# Patient Record
Sex: Female | Born: 1950 | Race: White | Hispanic: No | Marital: Single | Smoking: Current every day smoker
Health system: Southern US, Community
[De-identification: ages and names within clinical notes are randomized; demographics above are authoritative.]

## PROBLEM LIST (undated history)

## (undated) DIAGNOSIS — I700 Atherosclerosis of aorta: Secondary | ICD-10-CM

## (undated) DIAGNOSIS — M810 Age-related osteoporosis without current pathological fracture: Secondary | ICD-10-CM

## (undated) DIAGNOSIS — F17210 Nicotine dependence, cigarettes, uncomplicated: Secondary | ICD-10-CM

## (undated) DIAGNOSIS — B351 Tinea unguium: Secondary | ICD-10-CM

## (undated) DIAGNOSIS — Z8249 Family history of ischemic heart disease and other diseases of the circulatory system: Secondary | ICD-10-CM

## (undated) DIAGNOSIS — D229 Melanocytic nevi, unspecified: Secondary | ICD-10-CM

## (undated) DIAGNOSIS — Z9889 Other specified postprocedural states: Secondary | ICD-10-CM

## (undated) DIAGNOSIS — H269 Unspecified cataract: Secondary | ICD-10-CM

## (undated) DIAGNOSIS — R030 Elevated blood-pressure reading, without diagnosis of hypertension: Secondary | ICD-10-CM

## (undated) DIAGNOSIS — C50919 Malignant neoplasm of unspecified site of unspecified female breast: Secondary | ICD-10-CM

## (undated) DIAGNOSIS — E785 Hyperlipidemia, unspecified: Secondary | ICD-10-CM

## (undated) DIAGNOSIS — M8580 Other specified disorders of bone density and structure, unspecified site: Secondary | ICD-10-CM

## (undated) DIAGNOSIS — E559 Vitamin D deficiency, unspecified: Secondary | ICD-10-CM

## (undated) DIAGNOSIS — F32A Depression, unspecified: Secondary | ICD-10-CM

## (undated) DIAGNOSIS — J439 Emphysema, unspecified: Secondary | ICD-10-CM

## (undated) DIAGNOSIS — I2510 Atherosclerotic heart disease of native coronary artery without angina pectoris: Secondary | ICD-10-CM

## (undated) DIAGNOSIS — Z98811 Dental restoration status: Secondary | ICD-10-CM

## (undated) DIAGNOSIS — R112 Nausea with vomiting, unspecified: Secondary | ICD-10-CM

## (undated) HISTORY — DX: Atherosclerosis of aorta: I70.0

## (undated) HISTORY — DX: Other specified disorders of bone density and structure, unspecified site: M85.80

## (undated) HISTORY — DX: Hyperlipidemia, unspecified: E78.5

## (undated) HISTORY — DX: Depression, unspecified: F32.A

## (undated) HISTORY — DX: Emphysema, unspecified: J43.9

## (undated) HISTORY — DX: Tinea unguium: B35.1

## (undated) HISTORY — PX: AUGMENTATION MAMMAPLASTY: SUR837

## (undated) HISTORY — DX: Vitamin D deficiency, unspecified: E55.9

## (undated) HISTORY — DX: Family history of ischemic heart disease and other diseases of the circulatory system: Z82.49

## (undated) HISTORY — DX: Age-related osteoporosis without current pathological fracture: M81.0

## (undated) HISTORY — DX: Melanocytic nevi, unspecified: D22.9

## (undated) HISTORY — DX: Nicotine dependence, cigarettes, uncomplicated: F17.210

## (undated) HISTORY — PX: BREAST ENHANCEMENT SURGERY: SHX7

## (undated) HISTORY — DX: Atherosclerotic heart disease of native coronary artery without angina pectoris: I25.10

## (undated) HISTORY — DX: Elevated blood-pressure reading, without diagnosis of hypertension: R03.0

---

## 1998-08-07 ENCOUNTER — Other Ambulatory Visit: Admission: RE | Admit: 1998-08-07 | Discharge: 1998-08-07 | Payer: Self-pay | Admitting: Obstetrics and Gynecology

## 1998-08-20 ENCOUNTER — Ambulatory Visit (HOSPITAL_COMMUNITY): Admission: RE | Admit: 1998-08-20 | Discharge: 1998-08-20 | Payer: Self-pay | Admitting: Obstetrics and Gynecology

## 1998-08-20 ENCOUNTER — Encounter: Payer: Self-pay | Admitting: Obstetrics and Gynecology

## 2001-04-13 ENCOUNTER — Other Ambulatory Visit: Admission: RE | Admit: 2001-04-13 | Discharge: 2001-04-13 | Payer: Self-pay | Admitting: Obstetrics and Gynecology

## 2012-02-02 ENCOUNTER — Encounter: Payer: Self-pay | Admitting: Internal Medicine

## 2012-06-07 ENCOUNTER — Encounter: Payer: Self-pay | Admitting: Internal Medicine

## 2012-07-15 ENCOUNTER — Encounter: Payer: Self-pay | Admitting: Internal Medicine

## 2012-07-15 ENCOUNTER — Ambulatory Visit (AMBULATORY_SURGERY_CENTER): Payer: BC Managed Care – PPO | Admitting: *Deleted

## 2012-07-15 VITALS — Ht 60.0 in | Wt 114.0 lb

## 2012-07-15 DIAGNOSIS — Z1211 Encounter for screening for malignant neoplasm of colon: Secondary | ICD-10-CM

## 2012-07-15 MED ORDER — MOVIPREP 100 G PO SOLR: ORAL | Status: DC

## 2012-08-05 ENCOUNTER — Ambulatory Visit (AMBULATORY_SURGERY_CENTER): Payer: BC Managed Care – PPO | Admitting: Internal Medicine

## 2012-08-05 ENCOUNTER — Encounter: Payer: Self-pay | Admitting: Internal Medicine

## 2012-08-05 VITALS — BP 138/80 | HR 77 | Temp 98.3°F | Resp 18 | Ht 60.0 in | Wt 114.0 lb

## 2012-08-05 DIAGNOSIS — D126 Benign neoplasm of colon, unspecified: Secondary | ICD-10-CM

## 2012-08-05 DIAGNOSIS — Z1211 Encounter for screening for malignant neoplasm of colon: Secondary | ICD-10-CM

## 2012-08-05 HISTORY — PX: COLONOSCOPY WITH PROPOFOL: SHX5780

## 2012-08-05 MED ORDER — SODIUM CHLORIDE 0.9 % IV SOLN: 500.0000 mL | INTRAVENOUS | Status: DC

## 2012-08-05 NOTE — Patient Instructions (Addendum)
Discharge instructions given with verbal understanding. Handouts on polyps and diverticulosis. Resume previous medications. Hold aspirin and aspirin products for two weeks. YOU HAD AN ENDOSCOPIC PROCEDURE TODAY AT THE [facility]: Refer to the procedure report that was given to you for any specific questions about what was found during the examination.  If the procedure report does not answer your questions, please call your gastroenterologist to clarify.  If you requested that your care partner not be given the details of your procedure findings, then the procedure report has been included in a sealed envelope for you to review at your convenience later.  YOU SHOULD EXPECT: Some feelings of bloating in the abdomen. Passage of more gas than usual.  Walking can help get rid of the air that was put into your GI tract during the procedure and reduce the bloating. If you had a lower endoscopy (such as a colonoscopy or flexible sigmoidoscopy) you may notice spotting of blood in your stool or on the toilet paper. If you underwent a bowel prep for your procedure, then you may not have a normal bowel movement for a few days.  DIET: Your first meal following the procedure should be a light meal and then it is ok to progress to your normal diet.  A half-sandwich or bowl of soup is an example of a good first meal.  Heavy or fried foods are harder to digest and may make you feel nauseous or bloated.  Likewise meals heavy in dairy and vegetables can cause extra gas to form and this can also increase the bloating.  Drink plenty of fluids but you should avoid alcoholic beverages for 24 hours.  ACTIVITY: Your care partner should take you home directly after the procedure.  You should plan to take it easy, moving slowly for the rest of the day.  You can resume normal activity the day after the procedure however you should NOT DRIVE or use heavy machinery for 24 hours (because of the sedation medicines used  during the test).    SYMPTOMS TO REPORT IMMEDIATELY: A gastroenterologist can be reached at any hour.  During normal business hours, 8:30 AM to 5:00 PM Monday through Friday, call [phone].  After hours and on weekends, please call the GI answering service at [phone] who will take a message and have the physician on call contact you.   Following lower endoscopy (colonoscopy or flexible sigmoidoscopy):  Excessive amounts of blood in the stool  Significant tenderness or worsening of abdominal pains  Swelling of the abdomen that is new, acute  Fever of 100F or higher  FOLLOW UP: If any biopsies were taken you will be contacted by phone or by letter within the next 1-3 weeks.  Call your gastroenterologist if you have not heard about the biopsies in 3 weeks.  Our staff will call the home number listed on your records the next business day following your procedure to check on you and address any questions or concerns that you may have at that time regarding the information given to you following your procedure. This is a courtesy call and so if there is no answer at the home number and we have not heard from you through the emergency physician on call, we will assume that you have returned to your regular daily activities without incident.  SIGNATURES/CONFIDENTIALITY: You and/or your care partner have signed paperwork which will be entered into your electronic medical record.  These signatures attest to the fact that that  the information above on your After Visit Summary has been reviewed and is understood.  Full responsibility of the confidentiality of this discharge information lies with you and/or your care-partner.

## 2012-08-05 NOTE — Op Note (Signed)
[facility] [address] [place], [address]   COLONOSCOPY PROCEDURE REPORT  PATIENT: Jasmine, Blanchard  MR#: [id number] BIRTHDATE: Sep 01, 1950 , 62  yrs. old GENDER: Female ENDOSCOPIST: Hart Carwin, MD REFERRED BY:  Creola Corn, M.D. PROCEDURE DATE:  08/05/2012 PROCEDURE:   Colonoscopy with cold biopsy polypectomy and Colonoscopy with snare polypectomy ASA CLASS:   Class I INDICATIONS:Average risk patient for colon cancer and normal colonoscopy in1/2004. MEDICATIONS: MAC sedation, administered by CRNA, Propofol (Diprivan), and propofol (Diprivan) 200mg  IV  DESCRIPTION OF PROCEDURE:   After the risks and benefits and of the procedure were explained, informed consent was obtained.  A digital rectal exam revealed no abnormalities of the rectum.    The LB [id number]-[id number]  endoscope was introduced through the anus and advanced to the cecum, which was identified by both the appendix and ileocecal valve .  The quality of the prep was excellent, using MoviPrep .  The instrument was then slowly withdrawn as the colon was fully examined.     COLON FINDINGS: Three polypoid shaped sessile polyps ranging between 3-62mm in size were found at the cecum.x2 and 10 cm x1  A polypectomy was performed with cold forceps ( at 10 cm)  and with a cold snare ( cecum).  The resection was complete and the polyp tissue was completely retrieved. Mild diverticulosis of the sigmoid colon.    Retroflexed views revealed no abnormalities.     The scope was then withdrawn from the patient and the procedure completed.  COMPLICATIONS: There were no complications. ENDOSCOPIC IMPRESSION: Three sessile polyps ranging between 3-38mm in size were found at the cecum; polypectomy was performed with cold forceps and with a cold snare Mild diverticulosis of sigmoid colon  RECOMMENDATIONS: 1.  Await pathology results 2.  High fiber diet 3. no ASA x 2 weeks  REPEAT EXAM: In 5 year(s)  for  Colonoscopy.  cc:  _______________________________ eSignedHart Carwin, MD 08/05/2012 9:43 AM     PATIENT NAME:  Jasmine, Blanchard MR#: [id number]

## 2012-08-05 NOTE — Progress Notes (Signed)
Called to room to assist during endoscopic procedure.  Patient ID and intended procedure confirmed with present staff. Received instructions for my participation in the procedure from the performing physician.  

## 2012-08-05 NOTE — Progress Notes (Signed)
Patient did not experience any of the following events: a burn prior to discharge; a fall within the facility; wrong site/side/patient/procedure/implant event; or a hospital transfer or hospital admission upon discharge from the facility. (G8907) Patient did not have preoperative order for IV antibiotic SSI prophylaxis. (G8918)  

## 2012-08-08 ENCOUNTER — Telehealth: Payer: Self-pay

## 2012-08-08 NOTE — Telephone Encounter (Signed)
  Follow up Call-  Call back number 08/05/2012  Post procedure Call Back phone  # [phone]  Permission to leave phone message Yes     Patient questions:  Do you have a fever, pain , or abdominal swelling? no Pain Score  0 *  Have you tolerated food without any problems? yes  Have you been able to return to your normal activities? yes  Do you have any questions about your discharge instructions: Diet   no Medications  no Follow up visit  no  Do you have questions or concerns about your Care? no  Actions: * If pain score is 4 or above: No action needed, pain <4.

## 2012-08-09 ENCOUNTER — Encounter: Payer: Self-pay | Admitting: Internal Medicine

## 2012-10-23 ENCOUNTER — Emergency Department (HOSPITAL_COMMUNITY)
Admission: EM | Admit: 2012-10-23 | Discharge: 2012-10-23 | Disposition: A | Discharge status: Home / Self Care | Payer: BC Managed Care – PPO | Attending: Emergency Medicine | Admitting: Emergency Medicine

## 2012-10-23 ENCOUNTER — Encounter (HOSPITAL_COMMUNITY): Payer: Self-pay | Admitting: Emergency Medicine

## 2012-10-23 DIAGNOSIS — Z7982 Long term (current) use of aspirin: Secondary | ICD-10-CM | POA: Insufficient documentation

## 2012-10-23 DIAGNOSIS — F17200 Nicotine dependence, unspecified, uncomplicated: Secondary | ICD-10-CM | POA: Insufficient documentation

## 2012-10-23 DIAGNOSIS — Z79899 Other long term (current) drug therapy: Secondary | ICD-10-CM | POA: Insufficient documentation

## 2012-10-23 DIAGNOSIS — Z862 Personal history of diseases of the blood and blood-forming organs and certain disorders involving the immune mechanism: Secondary | ICD-10-CM | POA: Insufficient documentation

## 2012-10-23 DIAGNOSIS — Z8639 Personal history of other endocrine, nutritional and metabolic disease: Secondary | ICD-10-CM | POA: Insufficient documentation

## 2012-10-23 DIAGNOSIS — B029 Zoster without complications: Secondary | ICD-10-CM | POA: Insufficient documentation

## 2012-10-23 MED ORDER — VALACYCLOVIR HCL 1 G PO TABS: 1000.0000 mg | ORAL_TABLET | Freq: Three times a day (TID) | ORAL | Status: AC

## 2012-10-23 NOTE — ED Provider Notes (Signed)
CSN: [id number]     Arrival date & time 10/23/12  4098 History   First MD Initiated Contact with Patient 10/23/12 0759     Chief Complaint  Patient presents with  . Rash   (Consider location/radiation/quality/duration/timing/severity/associated sxs/prior Treatment) HPI Comments: Jasmine Blanchard is a 62 y.o. female who states that she developed a rash on her right upper thigh 3 days ago. That has gradually worsened and spread to the right buttocks and right posterior knee. The rash is mildly uncomfortable and itchy. No fever, chills, nausea, vomiting, weakness, or dizziness. She had a mole removed from the right lower flank 3 weeks ago. This area is just above the location of the rash on her right buttock. There no known modifying factors. There been no new medications taken.   Patient is a 62 y.o. female presenting with rash. The history is provided by the patient.  Rash   Past Medical History  Diagnosis Date  . Hyperlipidemia   . Elevated triglycerides with high cholesterol    Past Surgical History  Procedure Laterality Date  . Cosmetic surgery      x2   Family History  Problem Relation Age of Onset  . Colon cancer Neg Hx    History  Substance Use Topics  . Smoking status: Current Every Day Smoker -- 1.00 packs/day for 40 years    Types: Cigarettes  . Smokeless tobacco: Never Used  . Alcohol Use: 8.4 oz/week    14 Glasses of wine per week     Comment: about 1-2 glasses wine daily per pt.   OB History   Grav Para Term Preterm Abortions TAB SAB Ect Mult Living                 Review of Systems  Skin: Positive for rash.  All other systems reviewed and are negative.    Allergies  Review of patient's allergies indicates no known allergies.  Home Medications   Current Outpatient Rx  Name  Route  Sig  Dispense  Refill  . aspirin 81 MG tablet   Oral   Take 81 mg by mouth daily.         Marland Kitchen BIOTIN PO   Oral   Take 1 tablet by mouth daily.         .  Cholecalciferol (VITAMIN D PO)   Oral   Take 1 tablet by mouth daily.         . Coenzyme Q10 (CO Q-10 PO)   Oral   Take 1 tablet by mouth daily.         . fenofibrate 54 MG tablet   Oral   Take 54 mg by mouth daily.         . Omega-3 Fatty Acids (FISH OIL PO)   Oral   Take 1 tablet by mouth daily.         . valACYclovir (VALTREX) 1000 MG tablet   Oral   Take 1 tablet (1,000 mg total) by mouth 3 (three) times daily.   31 tablet   0    BP 156/83  Pulse 109  Temp(Src) 98.4 F (36.9 C) (Oral)  Resp 15  Wt 109 lb (49.442 kg)  BMI 21.29 kg/m2  SpO2 98% Physical Exam  Nursing note and vitals reviewed. Constitutional: She is oriented to person, place, and time. She appears well-developed and well-nourished.  HENT:  Head: Normocephalic and atraumatic.  Eyes: Conjunctivae and EOM are normal. Pupils are equal, round, and reactive to  light.  Neck: Normal range of motion and phonation normal. Neck supple.  Cardiovascular: Normal rate and intact distal pulses.   Pulmonary/Chest: Effort normal. She exhibits no tenderness.  Musculoskeletal: Normal range of motion.  Neurological: She is alert and oriented to person, place, and time. She exhibits normal muscle tone.  Skin: Skin is warm and dry.  Red blotchy rash, flat, right upper medial thigh, right, buttock, and small patch on right posterior knee. The area on the right medial thigh has numerous vesicles. There is no associated induration, drainage, or associated lymphadenopathy.  Psychiatric: She has a normal mood and affect. Her behavior is normal. Judgment and thought content normal.    ED Course  Procedures (including critical care time)  Findings discussed with patient. She agrees with the treatment plan.  MDM   1. Shingles    Nonspecific rash with differential including allergic dermatitis, drug reaction, shingles, nonspecific viral process. Her symptoms are mild. She is stable for discharge.  Nursing Notes  Reviewed/ Care Coordinated, and agree without changes. Applicable Imaging Reviewed.  Interpretation of Laboratory Data incorporated into ED treatment   Plan: Home Medications- Valtrex; Home Treatments and Observation- rash care by daily cleansing ; return here if the recommended treatment, does not improve the symptoms; Recommended follow up- PCP, when necessary      Flint Melter, MD 10/23/12 [phone]

## 2012-10-23 NOTE — ED Notes (Signed)
Pt. Stated, i started having a rash 2 days ago.  Rash is mostly on my bottom and it started between my legs.

## 2013-10-27 ENCOUNTER — Ambulatory Visit (INDEPENDENT_AMBULATORY_CARE_PROVIDER_SITE_OTHER): Payer: BC Managed Care – PPO

## 2013-10-27 ENCOUNTER — Encounter: Payer: Self-pay | Admitting: Podiatrist

## 2013-10-27 ENCOUNTER — Ambulatory Visit (INDEPENDENT_AMBULATORY_CARE_PROVIDER_SITE_OTHER): Payer: BC Managed Care – PPO | Admitting: Podiatrist

## 2013-10-27 VITALS — BP 140/88 | HR 91 | Resp 16 | Ht 61.0 in | Wt 107.0 lb

## 2013-10-27 DIAGNOSIS — M2010 Hallux valgus (acquired), unspecified foot: Secondary | ICD-10-CM

## 2013-10-27 NOTE — Patient Instructions (Signed)

## 2013-10-27 NOTE — Progress Notes (Signed)
   Subjective:    Patient ID: Jasmine Blanchard, female    DOB: July 06, 1950, 63 y.o.   MRN: [id number]  HPI Comments: "I have these bunions"  Patient c/o discomfort 1st MPJ bilateral for several years. She only has pain when wearing dress shoes. The areas get red and swollen occasionally. Dr. Virgina Jock referred to have checked and any preventative or accommodative measures.  Foot Pain      Review of Systems  All other systems reviewed and are negative.      Objective:   Physical Exam  Patient is awake, alert, and oriented x 3.  In no acute distress.  Vascular status is intact with palpable pedal pulses at 2/4 DP and PT bilateral and capillary refill time within normal limits. Neurological sensation is also intact bilaterally via Semmes Weinstein monofilament at 5/5 sites. Light touch, vibratory sensation, Achilles tendon reflex is intact. Dermatological exam reveals skin color, turger and texture as normal. No open lesions present.  Musculature intact with dorsiflexion, plantarflexion, inversion, eversion. Bunion deformity is present bilateral.  No pain with range of motion is present.  No pain with medial to lateral compression of the foot is noted.  Mild lateral deviation of the hallux is noted but not causing lateral drifting of the lesser toes.  Deformity present not causing pain  xrays taken and reveal bunion deformities bilateral.  Lateral deviation of the hallux also noted bilateral.   Assessment & Plan:  Bunion deformity bilateral  Plan:  Discussed conservative versus surgical options.  Discussed an orthotic to be worn in a lace up shoe to help prevent the further drifting of the first metatarsal and to prevent a worsening bunion and the patient is not interested due to not wearing this type of shoes on a normal basis.  Discussed very briefly surgical correction however she is not interested in this option at this time.  She will be seen back as needed for follow up.  Dispensed written  materals regarding bunions.

## 2014-12-17 ENCOUNTER — Encounter: Payer: Self-pay | Admitting: Internal Medicine

## 2016-07-09 ENCOUNTER — Telehealth: Payer: Self-pay | Admitting: Nurse Practitioner

## 2016-07-09 NOTE — Telephone Encounter (Signed)
Called and left a message for patient to call back to schedule a new patient doctor referral with Kem Boroughs, FNP for an AEX.

## 2016-07-15 ENCOUNTER — Other Ambulatory Visit: Payer: Self-pay | Admitting: Radiology

## 2016-07-16 DIAGNOSIS — C50919 Malignant neoplasm of unspecified site of unspecified female breast: Secondary | ICD-10-CM

## 2016-07-16 HISTORY — DX: Malignant neoplasm of unspecified site of unspecified female breast: C50.919

## 2016-07-17 ENCOUNTER — Telehealth: Payer: Self-pay | Admitting: *Deleted

## 2016-07-17 NOTE — Telephone Encounter (Signed)
Confirmed [facility] for 07/29/16 at 0815.  Instructions and contact information given.

## 2016-07-23 ENCOUNTER — Other Ambulatory Visit: Payer: Self-pay | Admitting: *Deleted

## 2016-07-23 DIAGNOSIS — Z170 Estrogen receptor positive status [ER+]: Principal | ICD-10-CM

## 2016-07-23 DIAGNOSIS — C50411 Malignant neoplasm of upper-outer quadrant of right female breast: Secondary | ICD-10-CM

## 2016-07-24 ENCOUNTER — Encounter: Payer: Self-pay | Admitting: Genetics

## 2016-07-28 ENCOUNTER — Encounter: Payer: Self-pay | Admitting: Radiation Oncology

## 2016-07-28 NOTE — Progress Notes (Signed)
Radiation Oncology         [phone] ________________________________  Name: Jasmine Blanchard MRN: [id number]  Date: 07/29/2016  DOB: 15-Dec-1950  HA:LPFXT, [name], MD  Alphonsa Overall, MD     REFERRING PHYSICIAN: Alphonsa Overall, MD   DIAGNOSIS: The encounter diagnosis was Malignant neoplasm of upper-outer quadrant of right breast in female, estrogen receptor positive ([place]).   HISTORY OF PRESENT ILLNESS: Jasmine Blanchard is a 66 y.o. female seen in the multidisciplinary breast clinic for a new diagnosis of right breast cancer. She was found on screening mammogram to have a right breast assymmetry. She underwent diagnostic imaging to follow this and this revealed an 8 mm mass at the 10 o'clock position in the upper outer quadrant of the right breast. No adenopathy was noted of the axilla by ultrasound. A biopsy on 07/15/16 of the right breast mass revealed a grade 1, ER/PR positive invasive ductal carcinoma. She comes today to discuss options for treatment of her cancer.    PREVIOUS RADIATION THERAPY: No   PAST MEDICAL HISTORY:  Past Medical History:  Diagnosis Date  . Breast cancer ([place]) 07/16/2016   right ER/PR positive, grade 1 invasive ductal carcinoma  . Elevated triglycerides with high cholesterol   . Hyperlipidemia        PAST SURGICAL HISTORY: Past Surgical History:  Procedure Laterality Date  . COSMETIC SURGERY     x2     FAMILY HISTORY:  Family History  Problem Relation Age of Onset  . Breast cancer Mother   . Colon cancer Neg Hx      SOCIAL HISTORY:  reports that she has been smoking Cigarettes.  She has a 40.00 pack-year smoking history. She has never used smokeless tobacco. She reports that she drinks about 8.4 oz of alcohol per week . She reports that she does not use drugs. The patient is single and resides in [place]. She is an Forensic psychologist.    ALLERGIES: Patient has no known allergies.   MEDICATIONS:  Current Outpatient Prescriptions  Medication Sig  Dispense Refill  . aspirin 81 MG tablet Take 81 mg by mouth daily.    Marland Kitchen BIOTIN PO Take 1 tablet by mouth daily.    . Cholecalciferol (VITAMIN D PO) Take 1 tablet by mouth daily.    . Coenzyme Q10 (CO Q-10 PO) Take 1 tablet by mouth daily.    . fenofibrate 54 MG tablet Take 54 mg by mouth daily.    . Omega-3 Fatty Acids (FISH OIL PO) Take 1 tablet by mouth daily.     No current facility-administered medications for this encounter.      REVIEW OF SYSTEMS: On review of systems, the patient reports that she is doing well overall. She denies any chest pain, shortness of breath, cough, fevers, chills, night sweats, unintended weight changes. She denies any bowel or bladder disturbances, and denies abdominal pain, nausea or vomiting. She denies any new musculoskeletal or joint aches or pains. A complete review of systems is obtained and is otherwise negative.     PHYSICAL EXAM:  Wt Readings from Last 3 Encounters:  07/29/16 113 lb 6.4 oz (51.4 kg)  10/27/13 107 lb (48.5 kg)  10/23/12 109 lb (49.4 kg)   Temp Readings from Last 3 Encounters:  07/29/16 98.3 F (36.8 C) (Oral)  10/23/12 98.4 F (36.9 C)  08/05/12 98.3 F (36.8 C) (Tympanic)   BP Readings from Last 3 Encounters:  07/29/16 (!) 165/87  10/27/13 140/88  10/23/12 156/83  Pulse Readings from Last 3 Encounters:  07/29/16 94  10/27/13 91  10/23/12 109     In general this is a well appearing caucasian female in no acute distress. She is alert and oriented x4 and appropriate throughout the examination. HEENT reveals that the patient is normocephalic, atraumatic. EOMs are intact. PERRLA. Skin is intact without any evidence of gross lesions. Cardiovascular exam reveals a regular rate and rhythm, no clicks rubs or murmurs are auscultated. Chest is clear to auscultation bilaterally. Lymphatic assessment is performed and does not reveal any adenopathy in the cervical, supraclavicular, axillary, or inguinal chains. Abdomen has active  bowel sounds in all quadrants and is intact. Bilateral breast exam is performed and reveals bilateral breast implants that are symmetric. There is a small ecchymosis of the right breast in the lower outer quadrant consistent with her biopsy site. No palpable mass is noted. No mass or abnormalities are palpable of the left breast, and no nipple bleeding or discharge is noted of either breast. The abdomen is soft, non tender, non distended. Lower extremities are negative for pretibial pitting edema, deep calf tenderness, cyanosis or clubbing.   ECOG = 0  0 - Asymptomatic (Fully active, able to carry on all predisease activities without restriction)  1 - Symptomatic but completely ambulatory (Restricted in physically strenuous activity but ambulatory and able to carry out work of a light or sedentary nature. For example, light housework, office work)  2 - Symptomatic, <50% in bed during the day (Ambulatory and capable of all self care but unable to carry out any work activities. Up and about more than 50% of waking hours)  3 - Symptomatic, >50% in bed, but not bedbound (Capable of only limited self-care, confined to bed or chair 50% or more of waking hours)  4 - Bedbound (Completely disabled. Cannot carry on any self-care. Totally confined to bed or chair)  5 - Death   Eustace Pen MM, Creech RH, Tormey DC, et al. [phone]). "Toxicity and response criteria of the [facility]". [place] Oncol. 5 (6): 649-55    LABORATORY DATA:  Lab Results  Component Value Date   WBC 7.0 07/29/2016   HGB 14.5 07/29/2016   HCT 41.4 07/29/2016   MCV 94.0 07/29/2016   PLT 352 07/29/2016   Lab Results  Component Value Date   NA 140 07/29/2016   K 4.2 07/29/2016   CO2 25 07/29/2016   Lab Results  Component Value Date   ALT 24 07/29/2016   AST 29 07/29/2016   ALKPHOS 56 07/29/2016   BILITOT 0.30 07/29/2016      RADIOGRAPHY: No results found.     IMPRESSION/PLAN: 1. 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 Dr. Lisbeth Renshaw discusses the pathology findings and reviews the nature of invasive breast disease. The consensus from the breast conference include breast conservation with lumpectomy with sentinel node assessment. If her tumor was larger than 1 cm, her tumor would be tested for oncotype to determine if there is a role for chemotherapy. Provided that chemotherapy is not indicated, the patient's course would then be followed by external radiotherapy to the breast followed by antiestrogen therapy. We discussed the risks, benefits, short, and long term effects of radiotherapy, and the patient is interested in proceeding. Dr. Lisbeth Renshaw discusses the delivery and logistics of radiotherapy, and given her in situ implants, he would recommend a course of 6 1/2 weeks. We will see her back about 2  weeks after surgery to move forward with the simulation and planning process and anticipate starting radiotherapy about 4 weeks after surgery.    The above documentation reflects my direct findings during this shared patient visit. Please see the separate note by Dr. Lisbeth Renshaw on this date for the remainder of the patient's plan of care.    Carola Rhine, PAC

## 2016-07-29 ENCOUNTER — Encounter: Payer: Self-pay | Admitting: Hematology and Oncology

## 2016-07-29 ENCOUNTER — Ambulatory Visit: Payer: Medicare Other | Attending: Surgery | Admitting: Physical Therapy

## 2016-07-29 ENCOUNTER — Encounter: Payer: Self-pay | Admitting: Physical Therapy

## 2016-07-29 ENCOUNTER — Ambulatory Visit
Admission: RE | Admit: 2016-07-29 | Discharge: 2016-07-29 | Disposition: A | Discharge status: Home / Self Care | Payer: Medicare Other | Source: Ambulatory Visit | Attending: Radiation Oncology | Admitting: Radiation Oncology

## 2016-07-29 ENCOUNTER — Ambulatory Visit (HOSPITAL_BASED_OUTPATIENT_CLINIC_OR_DEPARTMENT_OTHER): Payer: Medicare Other | Admitting: Hematology and Oncology

## 2016-07-29 ENCOUNTER — Other Ambulatory Visit (HOSPITAL_BASED_OUTPATIENT_CLINIC_OR_DEPARTMENT_OTHER): Payer: Medicare Other

## 2016-07-29 ENCOUNTER — Other Ambulatory Visit: Payer: Self-pay | Admitting: Surgery

## 2016-07-29 DIAGNOSIS — R293 Abnormal posture: Secondary | ICD-10-CM | POA: Diagnosis present

## 2016-07-29 DIAGNOSIS — F17210 Nicotine dependence, cigarettes, uncomplicated: Secondary | ICD-10-CM | POA: Insufficient documentation

## 2016-07-29 DIAGNOSIS — C50411 Malignant neoplasm of upper-outer quadrant of right female breast: Secondary | ICD-10-CM

## 2016-07-29 DIAGNOSIS — M8580 Other specified disorders of bone density and structure, unspecified site: Secondary | ICD-10-CM | POA: Insufficient documentation

## 2016-07-29 DIAGNOSIS — C50911 Malignant neoplasm of unspecified site of right female breast: Secondary | ICD-10-CM

## 2016-07-29 DIAGNOSIS — Z170 Estrogen receptor positive status [ER+]: Secondary | ICD-10-CM | POA: Diagnosis not present

## 2016-07-29 DIAGNOSIS — Z7982 Long term (current) use of aspirin: Secondary | ICD-10-CM | POA: Insufficient documentation

## 2016-07-29 DIAGNOSIS — Z510 Encounter for antineoplastic radiation therapy: Secondary | ICD-10-CM | POA: Insufficient documentation

## 2016-07-29 DIAGNOSIS — Z803 Family history of malignant neoplasm of breast: Secondary | ICD-10-CM | POA: Insufficient documentation

## 2016-07-29 HISTORY — DX: Malignant neoplasm of unspecified site of unspecified female breast: C50.919

## 2016-07-29 LAB — CBC WITH DIFFERENTIAL/PLATELET
BASO%: 0.4 % (ref 0.0–2.0)
Basophils Absolute: 0 10*3/uL (ref 0.0–0.1)
EOS%: 0.8 % (ref 0.0–7.0)
Eosinophils Absolute: 0.1 10*3/uL (ref 0.0–0.5)
HEMATOCRIT: 41.4 % (ref 34.8–46.6)
HEMOGLOBIN: 14.5 g/dL (ref 11.6–15.9)
LYMPH#: 1.8 10*3/uL (ref 0.9–3.3)
LYMPH%: 26.3 % (ref 14.0–49.7)
MCH: 32.9 pg (ref 25.1–34.0)
MCHC: 34.9 g/dL (ref 31.5–36.0)
MCV: 94 fL (ref 79.5–101.0)
MONO#: 0.6 10*3/uL (ref 0.1–0.9)
MONO%: 8.6 % (ref 0.0–14.0)
NEUT%: 63.9 % (ref 38.4–76.8)
NEUTROS ABS: 4.5 10*3/uL (ref 1.5–6.5)
PLATELETS: 352 10*3/uL (ref 145–400)
RBC: 4.4 10*6/uL (ref 3.70–5.45)
RDW: 13.2 % (ref 11.2–14.5)
WBC: 7 10*3/uL (ref 3.9–10.3)

## 2016-07-29 LAB — COMPREHENSIVE METABOLIC PANEL
ALBUMIN: 3.7 g/dL (ref 3.5–5.0)
ALK PHOS: 56 U/L (ref 40–150)
ALT: 24 U/L (ref 0–55)
ANION GAP: 11 meq/L (ref 3–11)
AST: 29 U/L (ref 5–34)
BILIRUBIN TOTAL: 0.3 mg/dL (ref 0.20–1.20)
BUN: 19.9 mg/dL (ref 7.0–26.0)
CALCIUM: 9.8 mg/dL (ref 8.4–10.4)
CHLORIDE: 104 meq/L (ref 98–109)
CO2: 25 mEq/L (ref 22–29)
CREATININE: 0.8 mg/dL (ref 0.6–1.1)
EGFR: 74 mL/min/{1.73_m2} — ABNORMAL LOW (ref >90)
Glucose: 108 mg/dl (ref 70–140)
Potassium: 4.2 mEq/L (ref 3.5–5.1)
Sodium: 140 mEq/L (ref 136–145)
TOTAL PROTEIN: 6.9 g/dL (ref 6.4–8.3)

## 2016-07-29 NOTE — Patient Instructions (Signed)

## 2016-07-29 NOTE — Progress Notes (Signed)
Nutrition Assessment  Reason for Assessment:  Pt seen in Breast Clinic  ASSESSMENT:   66 year old female with right breast cancer.  Past medical history reviewed.     Medications:  reviewed  Labs: reviewed  Anthropometrics:   Height: 61 inches Weight: 113 lb BMI: 21.5   NUTRITION DIAGNOSIS: Food and nutrition related knowledge deficit related to new diagnosis of breast cancer as evidenced by no prior need for nutrition related information.  INTERVENTION:   Discussed and provided packet of information regarding nutritional tips for breast cancer patients.  Questions answered.  Teachback method used.  Contact information provided and patient knows to contact me with questions/concerns.    MONITORING, EVALUATION, and GOAL: Pt will consume a healthy plant based diet to maintain lean body mass throughout treatment.   Jasmine Blanchard B. Zenia Resides, [place], [place] Registered Dietitian [phone] (pager)

## 2016-07-29 NOTE — Assessment & Plan Note (Signed)
07/15/2016 Screening detected right breast asymmetry upper outer quadrant 8 mm at 10:00 position axilla negative; ultrasound biopsy grade 1 invasive ductal carcinoma ER 100%, PR 95%, HER-2 negative ratio 1.34, Ki-67 3%, T1 BN 0 stage I a clinical stage  Pathology and radiology counseling: Discussed with the patient, the details of pathology including the type of breast cancer,the clinical staging, the significance of ER, PR and HER-2/neu receptors and the implications for treatment. After reviewing the pathology in detail, we proceeded to discuss the different treatment options between surgery, radiation, chemotherapy, antiestrogen therapies.  Recommendation: 1. Breast conserving surgery with sentinel lymph node biopsy followed by 2. adjuvant radiation therapy followed by 3. Adjuvant antiestrogen therapy with anastrozole 1 mg daily 5 years  Oncotype DX could be considered if the final tumor size is more than 1 cm. Return to clinic one week after surgery to discuss the pathology report.

## 2016-07-29 NOTE — Progress Notes (Signed)
[place] NOTE  Patient Care Team: Shon Baton, MD as PCP - General (Internal Medicine) Alphonsa Overall, MD as Consulting Physician (General Surgery) Nicholas Lose, MD as Consulting Physician (Hematology and Oncology) Kyung Rudd, MD as Consulting Physician (Radiation Oncology)  CHIEF COMPLAINTS/PURPOSE OF CONSULTATION:  Newly diagnosed breast cancer  HISTORY OF PRESENTING ILLNESS:  Jasmine Blanchard 66 y.o. female is here because of recent diagnosis of right breast cancer. Patient had a routine screening mammogram the detected and is symmetric in the right breast. This was not outer quadrant measuring 8 mm at 10:00 position by ultrasound. Axilla was negative. 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 She was presented this morning in the multidisciplinary tumor board and she is here today to discuss the overall treatment plan.   I reviewed her records extensively and collaborated the history with the patient.  SUMMARY OF ONCOLOGIC HISTORY:   Malignant neoplasm of upper-outer quadrant of right breast in female, estrogen receptor positive ([place])   07/15/2016 Initial Diagnosis    Screening detected right breast asymmetry upper outer quadrant 8 mm at 10:00 position axilla negative; ultrasound biopsy grade 1 invasive ductal carcinoma ER 100%, PR 95%, HER-2 negative ratio 1.34, Ki-67 3%, T1 BN 0 stage I a clinical stage      MEDICAL HISTORY:  Past Medical History:  Diagnosis Date  . Breast cancer ([place]) 07/16/2016   right ER/PR positive, grade 1 invasive ductal carcinoma  . Elevated triglycerides with high cholesterol   . Hyperlipidemia     SURGICAL HISTORY: Past Surgical History:  Procedure Laterality Date  . COSMETIC SURGERY     x2    SOCIAL HISTORY: Social History   Social History  . Marital status: Unknown    Spouse name: N/A  . Number of children: N/A  . Years of education: N/A    Occupational History  . Not on file.   Social History Main Topics  . Smoking status: Current Every Day Smoker    Packs/day: 1.00    Years: 40.00    Types: Cigarettes  . Smokeless tobacco: Never Used  . Alcohol use 8.4 oz/week    14 Glasses of wine per week     Comment: about 1-2 glasses wine daily per pt.  . Drug use: No  . Sexual activity: Not on file   Other Topics Concern  . Not on file   Social History Narrative  . No narrative on file    FAMILY HISTORY: Family History  Problem Relation Age of Onset  . Breast cancer Mother   . Colon cancer Neg Hx     ALLERGIES:  has No Known Allergies.  MEDICATIONS:  Current Outpatient Prescriptions  Medication Sig Dispense Refill  . aspirin 81 MG tablet Take 81 mg by mouth daily.    Marland Kitchen BIOTIN PO Take 1 tablet by mouth daily.    . Cholecalciferol (VITAMIN D PO) Take 1 tablet by mouth daily.    . Coenzyme Q10 (CO Q-10 PO) Take 1 tablet by mouth daily.    . fenofibrate 54 MG tablet Take 54 mg by mouth daily.    . Omega-3 Fatty Acids (FISH OIL PO) Take 1 tablet by mouth daily.     No current facility-administered medications for this visit.     REVIEW OF SYSTEMS:   Constitutional: Denies fevers, chills or abnormal night sweats Eyes: Denies blurriness of vision, double vision or watery eyes Ears, nose, mouth,  throat, and face: Denies mucositis or sore throat Respiratory: Denies cough, dyspnea or wheezes Cardiovascular: Denies palpitation, chest discomfort or lower extremity swelling Gastrointestinal:  Denies nausea, heartburn or change in bowel habits Skin: Denies abnormal skin rashes Lymphatics: Denies new lymphadenopathy or easy bruising Neurological:Denies numbness, tingling or new weaknesses Behavioral/Psych: Mood is stable, no new changes  Breast: Bilateral breast implants and bruising from the biopsy All other systems were reviewed with the patient and are negative.  PHYSICAL EXAMINATION: ECOG PERFORMANCE STATUS: 0  - Asymptomatic  Vitals:   07/29/16 0857  BP: (!) 165/87  Pulse: 94  Resp: 18  Temp: 98.3 F (36.8 C)   Filed Weights   07/29/16 0857  Weight: 113 lb 6.4 oz (51.4 kg)    GENERAL:alert, no distress and comfortable SKIN: skin color, texture, turgor are normal, no rashes or significant lesions EYES: normal, conjunctiva are pink and non-injected, sclera clear OROPHARYNX:no exudate, no erythema and lips, buccal mucosa, and tongue normal  NECK: supple, thyroid normal size, non-tender, without nodularity LYMPH:  no palpable lymphadenopathy in the cervical, axillary or inguinal LUNGS: clear to auscultation and percussion with normal breathing effort HEART: regular rate & rhythm and no murmurs and no lower extremity edema ABDOMEN:abdomen soft, non-tender and normal bowel sounds Musculoskeletal:no cyanosis of digits and no clubbing  PSYCH: alert & oriented x 3 with fluent speech NEURO: no focal motor/sensory deficits BREAST: No palpable nodules in breast. Bilateral breast implants were noted. No palpable axillary or supraclavicular lymphadenopathy (exam performed in the presence of a chaperone)   LABORATORY DATA:  I have reviewed the data as listed Lab Results  Component Value Date   WBC 7.0 07/29/2016   HGB 14.5 07/29/2016   HCT 41.4 07/29/2016   MCV 94.0 07/29/2016   PLT 352 07/29/2016   Lab Results  Component Value Date   NA 140 07/29/2016   K 4.2 07/29/2016   CO2 25 07/29/2016    RADIOGRAPHIC STUDIES: I have personally reviewed the radiological reports and agreed with the findings in the report.  ASSESSMENT AND PLAN:  Malignant neoplasm of upper-outer quadrant of right breast in female, estrogen receptor positive ([place]) 07/15/2016 Screening detected right breast asymmetry upper outer quadrant 8 mm at 10:00 position axilla negative; ultrasound biopsy grade 1 invasive ductal carcinoma ER 100%, PR 95%, HER-2 negative ratio 1.34, Ki-67 3%, T1 BN 0 stage I a clinical  stage  Pathology and radiology counseling: Discussed with the patient, the details of pathology including the type of breast cancer,the clinical staging, the significance of ER, PR and HER-2/neu receptors and the implications for treatment. After reviewing the pathology in detail, we proceeded to discuss the different treatment options between surgery, radiation, chemotherapy, antiestrogen therapies.  Recommendation: 1. Breast conserving surgery with sentinel lymph node biopsy followed by 2. adjuvant radiation therapy followed by 3. Adjuvant antiestrogen therapy with anastrozole 1 mg daily 5 years  Oncotype DX could be considered if the final tumor size is more than 1 cm. Return to clinic one week after surgery to discuss the pathology report.    All questions were answered. The patient knows to call the clinic with any problems, questions or concerns.    Rulon Eisenmenger, MD 07/29/16

## 2016-07-29 NOTE — Therapy (Signed)
[facility] [address] [place], [place], [address] Phone: [phone]   Fax:  [phone]  Physical Therapy Evaluation  Patient Details  Name: Jasmine Blanchard MRN: [id number] Date of Birth: Jun 02, 1950 Referring Provider: Dr. Ovidio Kin  Encounter Date: 07/29/2016      PT End of Session - 07/29/16 1023    Visit Number 1   Number of Visits 1   PT Start Time 0946   PT Stop Time 0955  Also saw pt from 1045 to 1100 for a total of 24 minutes   PT Time Calculation (min) 9 min   Activity Tolerance Patient tolerated treatment well   Behavior During Therapy [facility] for tasks assessed/performed      Past Medical History:  Diagnosis Date  . Breast cancer ([facility]) 07/16/2016   right ER/PR positive, grade 1 invasive ductal carcinoma  . Elevated triglycerides with high cholesterol   . Hyperlipidemia     Past Surgical History:  Procedure Laterality Date  . COSMETIC SURGERY     x2    There were no vitals filed for this visit.       Subjective Assessment - 07/29/16 1004    Subjective Patient reports she is here today to be seen by her medical team for her newly diagnosed right breast cancer.   Pertinent History Patient was diagnosed on 07/10/16 with right grade 1 invasive ductal carcinoma breast cancer. It measures 8 mm and is located in the upper outer quadrant. It is ER/PR positive and HER2 negative.   Patient Stated Goals Reduce lymphedema risk and learn post op shoulder ROM HEP   Currently in Pain? No/denies            [facility] PT Assessment - 07/29/16 0001      Assessment   Medical Diagnosis Right breast cancer   Referring Provider Dr. Ovidio Kin   Onset Date/Surgical Date 07/10/16   Hand Dominance Left   Prior Therapy none     Precautions   Precautions Other (comment)   Precaution Comments active cancer     Restrictions   Weight Bearing Restrictions No     Balance Screen   Has the patient fallen in the  past 6 months No   Has the patient had a decrease in activity level because of a fear of falling?  No   Is the patient reluctant to leave their home because of a fear of falling?  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   Leisure She does yoga 3x/week     Cognition   Overall Cognitive Status Within Functional Limits for tasks assessed     Posture/Postural Control   Posture/Postural Control Postural limitations   Postural Limitations Rounded Shoulders;Forward head     ROM / Strength   AROM / PROM / Strength AROM;Strength     AROM   AROM Assessment Site Shoulder;Cervical   Right/Left Shoulder Right;Left   Right Shoulder Extension 60 Degrees   Right Shoulder Flexion 149 Degrees   Right Shoulder ABduction 157 Degrees   Right Shoulder Internal Rotation 78 Degrees   Right Shoulder External Rotation 87 Degrees   Left Shoulder Extension 63 Degrees   Left Shoulder Flexion 132 Degrees   Left Shoulder ABduction  158 Degrees   Left Shoulder Internal Rotation 85 Degrees   Left Shoulder External Rotation 90 Degrees   Cervical Flexion WNl   Cervical Extension WNL   Cervical - Right Side Bend WNL   Cervical - Left Side Bend WNl   Cervical - Right Rotation WNL   Cervical - Left Rotation WNL     Strength   Overall Strength Within functional limits for tasks performed           LYMPHEDEMA/ONCOLOGY QUESTIONNAIRE - 07/29/16 1023      Type   Cancer Type Right breast cancer     Lymphedema Assessments   Lymphedema Assessments Upper extremities     Right Upper Extremity Lymphedema   10 cm Proximal to Olecranon Process 22.2 cm   Olecranon Process 20.7 cm   10 cm Proximal to Ulnar Styloid Process 19.1 cm   Just Proximal to Ulnar Styloid Process 14 cm    Across Hand at [employer] 17.4 cm   At [place] of 2nd Digit 5.4 cm     Left Upper Extremity Lymphedema   10 cm Proximal to Olecranon Process 23.1 cm   Olecranon Process 211 cm   10 cm Proximal to Ulnar Styloid Process 19.3 cm   Just Proximal to Ulnar Styloid Process 13.8 cm   Across Hand at [employer] 17.6 cm   At [place] of 2nd Digit 5.5 cm         Objective measurements completed on examination: See above findings.                  PT Education - 07/29/16 1023    Education provided Yes   Education Details Lymphedema risk reduction and post op shoulder ROM HEP   Person(s) Educated Patient   Methods Explanation;Demonstration;Handout   Comprehension Returned demonstration;Verbalized understanding              Breast Clinic Goals - 07/29/16 1040      Patient will be able to verbalize understanding of pertinent lymphedema risk reduction practices relevant to her diagnosis specifically related to skin care.   Time 1   Period Days   Status Achieved     Patient will be able to return demonstrate and/or verbalize understanding of the post-op home exercise program related to regaining shoulder range of motion.   Time 1   Period Days   Status Achieved     Patient will be able to verbalize understanding of the importance of attending the postoperative After Breast Cancer Class for further lymphedema risk reduction education and therapeutic exercise.   Time 1   Period Days   Status Achieved               Plan - 07/29/16 1024    Clinical Impression Statement Patient was diagnosed on 07/10/16 with right grade 1 invasive ductal carcinoma breast cancer. It measures 8 mm and is located in the upper outer quadrant. It is ER/PR positive and HER2 negative. She currently smokes 1/2 pack per day. Her multidisciplinary medical team met prior to her assessments to determine a recommended treatment plan. She is planning to have a right lumpectomy and sentinel node  biopsy followed by radiation and anti-estrogen therapy. She will have Oncotype testing if her mass ends up being > 1 cm. She may benefit from post op PT to regain shoudler ROM and reduce lymphedema risk.   History and Personal Factors relevant to plan of care: Lives alone; smoker   Clinical Presentation Stable  Clinical Presentation due to: Condition is stable   Clinical Decision Making Low   Rehab Potential Excellent   Clinical Impairments Affecting Rehab Potential None   PT Frequency One time visit   PT Treatment/Interventions Patient/family education;Therapeutic exercise   PT Next Visit Plan Will f/u after surgery to determine PT needs   PT Home Exercise Plan Post op shoulder ROM HEP   Consulted and Agree with Plan of Care Patient      Patient will benefit from skilled therapeutic intervention in order to improve the following deficits and impairments:  Postural dysfunction, Decreased knowledge of precautions, Pain, Impaired UE functional use, Decreased range of motion  Visit Diagnosis: Carcinoma of upper-outer quadrant of right breast in female, estrogen receptor positive ([place]) - Plan: PT plan of care cert/re-cert  Abnormal posture - Plan: PT plan of care cert/re-cert      G-Codes - 15/61/53 1044    Functional Assessment Tool Used (Outpatient Only) Clinical Judgement   Functional Limitation Other PT primary   Other PT Primary Current Status (P9432) At least 1 percent but less than 20 percent impaired, limited or restricted   Other PT Primary Goal Status (X6147) At least 1 percent but less than 20 percent impaired, limited or restricted   Other PT Primary Discharge Status (W9295) At least 1 percent but less than 20 percent impaired, limited or restricted     Patient will follow up at outpatient cancer rehab if needed following surgery.  If the patient requires physical therapy at that time, a specific plan will be dictated and sent to the referring physician for approval. The  patient was educated today on appropriate basic range of motion exercises to begin post operatively and the importance of attending the After Breast Cancer class following surgery.  Patient was educated today on lymphedema risk reduction practices as it pertains to recommendations that will benefit the patient immediately following surgery.  She verbalized good understanding.  No additional physical therapy is indicated at this time.     Problem List Patient Active Problem List   Diagnosis Date Noted  . Malignant neoplasm of upper-outer quadrant of right breast in female, estrogen receptor positive ([place]) 07/23/2016    Annia Friendly, PT 07/29/16 11:12 AM  [place], [place], [address] Phone: [phone]   Fax:  [phone]  Name: Jasmine Blanchard MRN: [id number] Date of Birth: 1950-07-21

## 2016-07-31 ENCOUNTER — Encounter (HOSPITAL_BASED_OUTPATIENT_CLINIC_OR_DEPARTMENT_OTHER): Payer: Self-pay | Admitting: *Deleted

## 2016-07-31 NOTE — Pre-Procedure Instructions (Signed)
To come pick up Boost Breeze 8 oz. - to drink by 0430 DOS

## 2016-08-03 ENCOUNTER — Ambulatory Visit: Payer: Medicare Other | Admitting: Nurse Practitioner

## 2016-08-03 ENCOUNTER — Ambulatory Visit: Payer: Medicare Other | Admitting: Obstetrics and Gynecology

## 2016-08-03 NOTE — Progress Notes (Signed)
Pt given Boost drink and instructed to drink by 0430 with teach back method.

## 2016-08-04 ENCOUNTER — Telehealth: Payer: Self-pay | Admitting: *Deleted

## 2016-08-04 NOTE — Progress Notes (Signed)
Jasmine Blanchard  Location: [facility] Patient #: [id number] DOB: 08/04/1950 Undefined / Language: Cleophus Molt / Race: White Female  History of Present Illness   The patient is a 66 year old female who presents with a complaint of right breast cancer.  The PCP is Dr. Shon Baton.  The patient was referred by Dr. Alfonso Patten. Marcelo Baldy.  The pateint is at the Breast [facility] - Oncology is Drs. [place] and [place]. She is accompanied with her friend - Caroleen Hamman.  She was gets annual mammograms. She had a mammogram on 07/10/2016 at [facility] which showed a new oval asymmetry in the right breast. Shewent back on 07/14/2016 for a biopsy of this area. her mother had breast cancer at the age of 59 and died for unrelated reasons the age of 32. She has no other history of breast cancer. Her last period was 2003. She is not on hormone replacement.  Mammograms: [facility] - UOQ lesion that measures 0.8 cm by US Biopsy: 07/15/2016 ([id number]) - IDC, grade 1, ER - 100%, PR - 95%, Ki67 - 3%, and Her2Neu - neg Family history of breast or ovarian cancer: Mother breast cancer at age 42 On hormone therapy: None  I discussed the options for breast cancer treatment with the patient. The patient is at the [facility], which includes medical oncology and radiation oncology. I discussed the surgical options of lumpectomy vs. mastectomy. If mastectomy, there is the possibility of reconstruction. I discussed the options of lymph node biopsy. The treatment plan depends on the pathologic staging of the tumor and the patient's personal wishes. The risks of surgery include, but are not limited to, bleeding, infection, the need for further surgery, and nerve injury. The patient has been given literature on the treatment of breast cancer. I talked to her about her implants. I don't think that the implants will be involved much in the surgery. She is unsure whether  the implant is subpectoral or not.  Plan: 1) Right breast lumpectomy and right axillary SLNBx, 2) Rad tx, 3) Antihormone tx  Past Medical History: 1. She has bilateral breast implants - She has silicone breast implants placed in 1987 in [place]. These were switched to saline implants about 1995. 2. Smokes 3. She had a neg colonoscopy by Dr. Olevia Perches in 08/05/2012  Social History: Unmarried. She is accompanied with her friend - Caroleen Hamman [facility] daughter teaches my wife Pilates) No children. 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 they do some finances she helps with.   Past Surgical History Conni Slipper, RN; 07/29/2016 7:41 AM) Breast Augmentation  Bilateral. Breast Biopsy  Right. Colon Polyp Removal - Colonoscopy  Oral Surgery   Diagnostic Studies History Conni Slipper, RN; 07/29/2016 7:41 AM) Colonoscopy  1-5 years ago Mammogram  within last year Pap Smear  1-5 years ago  Medication History Conni Slipper, RN; 07/29/2016 7:41 AM) Medications Reconciled  Social History Conni Slipper, RN; 07/29/2016 7:41 AM) Alcohol use  Moderate alcohol use. Caffeine use  Carbonated beverages. No drug use  Tobacco use  Current every day smoker.  Family History Conni Slipper, RN; 07/29/2016 7:41 AM) Arthritis  Mother. Breast Cancer  Mother. Heart Disease  Family Members In General, Father. Heart disease in female family member before age 11  Hypertension  Brother. Thyroid problems  Mother.  Pregnancy / Birth History Conni Slipper, RN; 07/29/2016 7:41 AM) Age at menarche  61 years. Age of menopause  [age] Contraceptive History  Oral contraceptives.  Other Problems (  Conni Slipper, RN; 07/29/2016 7:41 AM) Breast Cancer  Hypercholesterolemia  Lump In Breast  Melanoma  Thyroid Disease     Review of Systems Conni Slipper RN; 07/29/2016 7:41 AM) General Not Present- Appetite Loss, Chills, Fatigue, Fever, Night Sweats, Weight Gain and Weight Loss. Skin Not  Present- Change in Wart/Mole, Dryness, Hives, Jaundice, New Lesions, Non-Healing Wounds, Rash and Ulcer. HEENT Present- Wears glasses/contact lenses. Not Present- Earache, Hearing Loss, Hoarseness, Nose Bleed, Oral Ulcers, Ringing in the Ears, Seasonal Allergies, Sinus Pain, Sore Throat, Visual Disturbances and Yellow Eyes. Respiratory Not Present- Bloody sputum, Chronic Cough, Difficulty Breathing, Snoring and Wheezing. Breast Not Present- Breast Mass, Breast Pain, Nipple Discharge and Skin Changes. Cardiovascular Not Present- Chest Pain, Difficulty Breathing Lying Down, Leg Cramps, Palpitations, Rapid Heart Rate, Shortness of Breath and Swelling of Extremities. Gastrointestinal Not Present- Abdominal Pain, Bloating, Bloody Stool, Change in Bowel Habits, Chronic diarrhea, Constipation, Difficulty Swallowing, Excessive gas, Gets full quickly at meals, Hemorrhoids, Indigestion, Nausea, Rectal Pain and Vomiting. Female Genitourinary Not Present- Frequency, Nocturia, Painful Urination, Pelvic Pain and Urgency. Musculoskeletal Not Present- Back Pain, Joint Pain, Joint Stiffness, Muscle Pain, Muscle Weakness and Swelling of Extremities. Neurological Not Present- Decreased Memory, Fainting, Headaches, Numbness, Seizures, Tingling, Tremor, Trouble walking and Weakness. Psychiatric Not Present- Anxiety, Bipolar, Change in Sleep Pattern, Depression, Fearful and Frequent crying. Endocrine Not Present- Cold Intolerance, Excessive Hunger, Hair Changes, Heat Intolerance, Hot flashes and New Diabetes. Hematology Present- Blood Thinners. Not Present- Easy Bruising, Excessive bleeding, Gland problems, HIV and Persistent Infections.   Physical Exam  General: Thin WFalert and generally healthy appearing. Somewhat anxious. Skin: Inspection and palpation of the skin unremarkable.  Eyes: Conjunctivae white, pupils equal. Face, ears, nose, mouth, and throat: Face - normal. Normal ears and nose. Lips and teeth  normal.  Neck: Supple. No mass. Trachea midline. No thyroid mass.  Lymph Nodes: No supraclavicular or cervical adenopathy. No axillary adenopathy.  Lungs: Normal respiratory effort. Clear to auscultation and symmetric breath sounds. Cardiovascular: Regular rate and rythm. Normal auscultation of the heart. No murmur or rub.  Breasts - Right: Breast somewhat tight with implant - bruise in UOQ. but I do not feel a mass. Left: Breast somewhat tight with implant. no mass.  Abdomen: Soft. No mass. Liver and spleen not palpable. No tenderness. No hernia. Normal bowel sounds. No abdominal scars. Rectal: Not done.  Musculoskeletal/extremities: Normal gait. Good strength and ROM in upper and lower extremities.   Neurologic: Grossly intact to motor and sensory function.   Psychiatric: Has normal mood and affect. Judgement and insight appear normal.   Assessment & Plan  1.  MALIGNANT NEOPLASM OF RIGHT BREAST, STAGE 1, ESTROGEN RECEPTOR POSITIVE (C50.911)  Story: UOQ lesion that measures 0.8 cm by US  Biopsy: 07/15/2016 ([id number]) - IDC, grade 1, ER - 100%, PR - 95%, Ki67 - 3%, and Her2Neu - neg Oncology - Drs. Lindi Adie and [place]  Plan:  1) Right breast lumpectomy and right axillary SLNBx,  2) Rad tx,  3) Antihormone tx  2.  SMOKES (F17.200) 3. She has bilateral breast implants - She has silicone breast implants placed in 1987 in [place]. These were switched to saline implants about 1995.    Alphonsa Overall, MD, [facility] Pager: [phone] Office phone:  [phone]

## 2016-08-04 NOTE — Telephone Encounter (Signed)
Spoke to pt regarding [place] from 07/29/16. Denies questions or concerns regarding dx or treatment care plan. Scheduled and confirmed f/u appt for 7/27 at 8:30. Encourage pt to call with needs. Received verbal understanding.

## 2016-08-05 ENCOUNTER — Encounter (HOSPITAL_BASED_OUTPATIENT_CLINIC_OR_DEPARTMENT_OTHER): Payer: Self-pay | Admitting: Anesthesiology

## 2016-08-05 ENCOUNTER — Encounter (HOSPITAL_BASED_OUTPATIENT_CLINIC_OR_DEPARTMENT_OTHER)
Admission: RE | Disposition: A | Discharge status: Home / Self Care | Payer: Self-pay | Source: Ambulatory Visit | Attending: Surgery

## 2016-08-05 ENCOUNTER — Ambulatory Visit (HOSPITAL_BASED_OUTPATIENT_CLINIC_OR_DEPARTMENT_OTHER): Payer: Medicare Other | Admitting: Anesthesiology

## 2016-08-05 ENCOUNTER — Encounter (HOSPITAL_COMMUNITY)
Admission: RE | Admit: 2016-08-05 | Discharge: 2016-08-05 | Disposition: A | Discharge status: Home / Self Care | Payer: Medicare Other | Source: Ambulatory Visit | Attending: Surgery | Admitting: Surgery

## 2016-08-05 ENCOUNTER — Ambulatory Visit (HOSPITAL_BASED_OUTPATIENT_CLINIC_OR_DEPARTMENT_OTHER)
Admission: RE | Admit: 2016-08-05 | Discharge: 2016-08-05 | Disposition: A | Discharge status: Home / Self Care | Payer: Medicare Other | Source: Ambulatory Visit | Attending: Surgery | Admitting: Surgery

## 2016-08-05 DIAGNOSIS — Z803 Family history of malignant neoplasm of breast: Secondary | ICD-10-CM | POA: Diagnosis not present

## 2016-08-05 DIAGNOSIS — Z9882 Breast implant status: Secondary | ICD-10-CM | POA: Insufficient documentation

## 2016-08-05 DIAGNOSIS — E7800 Pure hypercholesterolemia, unspecified: Secondary | ICD-10-CM | POA: Diagnosis not present

## 2016-08-05 DIAGNOSIS — C50911 Malignant neoplasm of unspecified site of right female breast: Secondary | ICD-10-CM

## 2016-08-05 DIAGNOSIS — Z170 Estrogen receptor positive status [ER+]: Secondary | ICD-10-CM | POA: Insufficient documentation

## 2016-08-05 DIAGNOSIS — C50411 Malignant neoplasm of upper-outer quadrant of right female breast: Secondary | ICD-10-CM | POA: Insufficient documentation

## 2016-08-05 DIAGNOSIS — F17200 Nicotine dependence, unspecified, uncomplicated: Secondary | ICD-10-CM | POA: Insufficient documentation

## 2016-08-05 HISTORY — DX: Unspecified cataract: H26.9

## 2016-08-05 HISTORY — DX: Nausea with vomiting, unspecified: Z98.890

## 2016-08-05 HISTORY — DX: Nausea with vomiting, unspecified: R11.2

## 2016-08-05 HISTORY — DX: Dental restoration status: Z98.811

## 2016-08-05 HISTORY — PX: BREAST LUMPECTOMY WITH RADIOACTIVE SEED AND SENTINEL LYMPH NODE BIOPSY: SHX6550

## 2016-08-05 SURGERY — BREAST LUMPECTOMY WITH RADIOACTIVE SEED AND SENTINEL LYMPH NODE BIOPSY
Anesthesia: General | Site: Breast | Laterality: Right

## 2016-08-05 MED ORDER — LIDOCAINE HCL (CARDIAC) 20 MG/ML IV SOLN
INTRAVENOUS | Status: DC | PRN
  Administered 2016-08-05: 60 mg via INTRAVENOUS

## 2016-08-05 MED ORDER — CEFAZOLIN SODIUM-DEXTROSE 2-4 GM/100ML-% IV SOLN
INTRAVENOUS | Status: AC
  Filled 2016-08-05: qty 100

## 2016-08-05 MED ORDER — METHYLENE BLUE 0.5 % INJ SOLN
INTRAVENOUS | Status: AC
  Filled 2016-08-05: qty 20

## 2016-08-05 MED ORDER — CHLORHEXIDINE GLUCONATE CLOTH 2 % EX PADS: 6.0000 | MEDICATED_PAD | Freq: Once | CUTANEOUS | Status: DC

## 2016-08-05 MED ORDER — SCOPOLAMINE 1 MG/3DAYS TD PT72: 1.0000 | MEDICATED_PATCH | Freq: Once | TRANSDERMAL | Status: DC | PRN

## 2016-08-05 MED ORDER — PROPOFOL 500 MG/50ML IV EMUL
INTRAVENOUS | Status: AC
  Filled 2016-08-05: qty 50

## 2016-08-05 MED ORDER — SODIUM CHLORIDE 0.9 % IJ SOLN
INTRAMUSCULAR | Status: AC
Start: 2016-08-05 — End: 2016-08-05
  Filled 2016-08-05: qty 10

## 2016-08-05 MED ORDER — BUPIVACAINE-EPINEPHRINE (PF) 0.5% -1:200000 IJ SOLN
INTRAMUSCULAR | Status: DC | PRN
  Administered 2016-08-05: 5 mL

## 2016-08-05 MED ORDER — LACTATED RINGERS IV SOLN: INTRAVENOUS | Status: DC

## 2016-08-05 MED ORDER — ACETAMINOPHEN 500 MG PO TABS
ORAL_TABLET | ORAL | Status: AC
  Filled 2016-08-05: qty 2

## 2016-08-05 MED ORDER — MIDAZOLAM HCL 2 MG/2ML IJ SOLN
INTRAMUSCULAR | Status: AC
  Filled 2016-08-05: qty 2

## 2016-08-05 MED ORDER — DEXAMETHASONE SODIUM PHOSPHATE 10 MG/ML IJ SOLN
INTRAMUSCULAR | Status: AC
  Filled 2016-08-05: qty 1

## 2016-08-05 MED ORDER — SODIUM CHLORIDE 0.9 % IJ SOLN
INTRAMUSCULAR | Status: AC
  Filled 2016-08-05: qty 10

## 2016-08-05 MED ORDER — CEFAZOLIN SODIUM-DEXTROSE 2-4 GM/100ML-% IV SOLN
2.0000 g | INTRAVENOUS | Status: AC
  Administered 2016-08-05: 2 g via INTRAVENOUS

## 2016-08-05 MED ORDER — FENTANYL CITRATE (PF) 100 MCG/2ML IJ SOLN
50.0000 ug | INTRAMUSCULAR | Status: DC | PRN
  Administered 2016-08-05: 25 ug via INTRAVENOUS
  Administered 2016-08-05: 100 ug via INTRAVENOUS

## 2016-08-05 MED ORDER — GABAPENTIN 300 MG PO CAPS
ORAL_CAPSULE | ORAL | Status: AC
  Filled 2016-08-05: qty 1

## 2016-08-05 MED ORDER — MIDAZOLAM HCL 2 MG/2ML IJ SOLN
1.0000 mg | INTRAMUSCULAR | Status: DC | PRN
  Administered 2016-08-05: 2 mg via INTRAVENOUS

## 2016-08-05 MED ORDER — ONDANSETRON HCL 4 MG/2ML IJ SOLN
INTRAMUSCULAR | Status: DC | PRN
  Administered 2016-08-05: 4 mg via INTRAVENOUS

## 2016-08-05 MED ORDER — FENTANYL CITRATE (PF) 100 MCG/2ML IJ SOLN
INTRAMUSCULAR | Status: AC
  Filled 2016-08-05: qty 2

## 2016-08-05 MED ORDER — TECHNETIUM TC 99M SULFUR COLLOID FILTERED
1.0000 | Freq: Once | INTRAVENOUS | Status: AC | PRN
  Administered 2016-08-05: 1 via INTRADERMAL

## 2016-08-05 MED ORDER — BUPIVACAINE-EPINEPHRINE (PF) 0.5% -1:200000 IJ SOLN
INTRAMUSCULAR | Status: DC | PRN
  Administered 2016-08-05: 30 mL via PERINEURAL

## 2016-08-05 MED ORDER — SODIUM CHLORIDE 0.9 % IJ SOLN
INTRAMUSCULAR | Status: DC | PRN
  Administered 2016-08-05: 5 mL

## 2016-08-05 MED ORDER — GABAPENTIN 300 MG PO CAPS
300.0000 mg | ORAL_CAPSULE | ORAL | Status: AC
  Administered 2016-08-05: 300 mg via ORAL

## 2016-08-05 MED ORDER — HYDROCODONE-ACETAMINOPHEN 5-325 MG PO TABS: 1.0000 | ORAL_TABLET | Freq: Four times a day (QID) | ORAL | 0 refills | Status: DC | PRN

## 2016-08-05 MED ORDER — METOCLOPRAMIDE HCL 5 MG/ML IJ SOLN: 10.0000 mg | Freq: Once | INTRAMUSCULAR | Status: DC | PRN

## 2016-08-05 MED ORDER — ONDANSETRON HCL 4 MG/2ML IJ SOLN
INTRAMUSCULAR | Status: AC
Start: 2016-08-05 — End: 2016-08-05
  Filled 2016-08-05: qty 2

## 2016-08-05 MED ORDER — BUPIVACAINE-EPINEPHRINE (PF) 0.5% -1:200000 IJ SOLN
INTRAMUSCULAR | Status: AC
  Filled 2016-08-05: qty 60

## 2016-08-05 MED ORDER — LACTATED RINGERS IV SOLN
INTRAVENOUS | Status: DC
  Administered 2016-08-05 (×2): via INTRAVENOUS

## 2016-08-05 MED ORDER — ACETAMINOPHEN 500 MG PO TABS
1000.0000 mg | ORAL_TABLET | ORAL | Status: AC
  Administered 2016-08-05: 1000 mg via ORAL

## 2016-08-05 MED ORDER — DEXAMETHASONE SODIUM PHOSPHATE 4 MG/ML IJ SOLN
INTRAMUSCULAR | Status: DC | PRN
  Administered 2016-08-05: 10 mg via INTRAVENOUS

## 2016-08-05 MED ORDER — FENTANYL CITRATE (PF) 100 MCG/2ML IJ SOLN: 25.0000 ug | INTRAMUSCULAR | Status: DC | PRN

## 2016-08-05 MED ORDER — PROPOFOL 10 MG/ML IV BOLUS
INTRAVENOUS | Status: DC | PRN
  Administered 2016-08-05: 120 mg via INTRAVENOUS

## 2016-08-05 MED ORDER — MEPERIDINE HCL 25 MG/ML IJ SOLN: 6.2500 mg | INTRAMUSCULAR | Status: DC | PRN

## 2016-08-05 SURGICAL SUPPLY — 54 items
ADH SKN CLS APL DERMABOND .7 (GAUZE/BANDAGES/DRESSINGS) ×1
APL SKNCLS STERI-STRIP NONHPOA (GAUZE/BANDAGES/DRESSINGS)
BENZOIN TINCTURE PRP APPL 2/3 (GAUZE/BANDAGES/DRESSINGS) IMPLANT
BINDER BREAST LRG (GAUZE/BANDAGES/DRESSINGS) IMPLANT
BINDER BREAST MEDIUM (GAUZE/BANDAGES/DRESSINGS) ×2 IMPLANT
BLADE SURG 15 STRL LF DISP TIS (BLADE) ×1 IMPLANT
BLADE SURG 15 STRL SS (BLADE) ×3
CANISTER SUC SOCK COL 7IN (MISCELLANEOUS) IMPLANT
CANISTER SUCT 1200ML W/VALVE (MISCELLANEOUS) ×3 IMPLANT
CHLORAPREP W/TINT 26ML (MISCELLANEOUS) ×3 IMPLANT
CLIP VESOCCLUDE SM WIDE 6/CT (CLIP) ×3 IMPLANT
CLOSURE WOUND 1/2 X4 (GAUZE/BANDAGES/DRESSINGS)
COVER BACK TABLE 60X90IN (DRAPES) ×3 IMPLANT
COVER MAYO STAND STRL (DRAPES) ×3 IMPLANT
COVER PROBE W GEL 5X96 (DRAPES) ×3 IMPLANT
DECANTER SPIKE VIAL GLASS SM (MISCELLANEOUS) IMPLANT
DERMABOND ADVANCED (GAUZE/BANDAGES/DRESSINGS) ×2
DERMABOND ADVANCED .7 DNX12 (GAUZE/BANDAGES/DRESSINGS) ×1 IMPLANT
DEVICE DUBIN W/COMP PLATE 8390 (MISCELLANEOUS) ×3 IMPLANT
DRAPE LAPAROSCOPIC ABDOMINAL (DRAPES) ×3 IMPLANT
DRAPE UTILITY XL STRL (DRAPES) ×3 IMPLANT
DRSG PAD ABDOMINAL 8X10 ST (GAUZE/BANDAGES/DRESSINGS) IMPLANT
ELECT COATED BLADE 2.86 ST (ELECTRODE) ×3 IMPLANT
ELECT REM PT RETURN 9FT ADLT (ELECTROSURGICAL) ×3
ELECTRODE REM PT RTRN 9FT ADLT (ELECTROSURGICAL) ×1 IMPLANT
GAUZE SPONGE 4X4 12PLY STRL (GAUZE/BANDAGES/DRESSINGS) ×3 IMPLANT
GLOVE BIOGEL PI IND STRL 7.0 (GLOVE) IMPLANT
GLOVE BIOGEL PI INDICATOR 7.0 (GLOVE) ×4
GLOVE ECLIPSE 6.5 STRL STRAW (GLOVE) ×2 IMPLANT
GLOVE SURG SIGNA 7.5 PF LTX (GLOVE) ×6 IMPLANT
GOWN STRL REUS W/ TWL LRG LVL3 (GOWN DISPOSABLE) ×1 IMPLANT
GOWN STRL REUS W/ TWL XL LVL3 (GOWN DISPOSABLE) ×1 IMPLANT
GOWN STRL REUS W/TWL LRG LVL3 (GOWN DISPOSABLE) ×3
GOWN STRL REUS W/TWL XL LVL3 (GOWN DISPOSABLE) ×3
KIT MARKER MARGIN INK (KITS) ×3 IMPLANT
NDL HYPO 25X1 1.5 SAFETY (NEEDLE) ×1 IMPLANT
NDL SAFETY ECLIPSE 18X1.5 (NEEDLE) IMPLANT
NEEDLE HYPO 18GX1.5 SHARP (NEEDLE)
NEEDLE HYPO 25X1 1.5 SAFETY (NEEDLE) ×3 IMPLANT
NS IRRIG 1000ML POUR BTL (IV SOLUTION) ×3 IMPLANT
PACK BASIN DAY SURGERY FS (CUSTOM PROCEDURE TRAY) ×3 IMPLANT
PENCIL BUTTON HOLSTER BLD 10FT (ELECTRODE) ×3 IMPLANT
SHEET MEDIUM DRAPE 40X70 STRL (DRAPES) ×3 IMPLANT
SLEEVE SCD COMPRESS KNEE MED (MISCELLANEOUS) ×3 IMPLANT
SPONGE LAP 18X18 X RAY DECT (DISPOSABLE) ×3 IMPLANT
STRIP CLOSURE SKIN 1/2X4 (GAUZE/BANDAGES/DRESSINGS) IMPLANT
SUT MNCRL AB 4-0 PS2 18 (SUTURE) ×3 IMPLANT
SUT VICRYL 3-0 CR8 SH (SUTURE) ×3 IMPLANT
SYR CONTROL 10ML LL (SYRINGE) ×3 IMPLANT
TOWEL OR 17X24 6PK STRL BLUE (TOWEL DISPOSABLE) ×3 IMPLANT
TOWEL OR NON WOVEN STRL DISP B (DISPOSABLE) ×3 IMPLANT
TUBE CONNECTING 20'X1/4 (TUBING) ×1
TUBE CONNECTING 20X1/4 (TUBING) ×2 IMPLANT
YANKAUER SUCT BULB TIP NO VENT (SUCTIONS) ×3 IMPLANT

## 2016-08-05 NOTE — Anesthesia Procedure Notes (Signed)
Anesthesia Regional Block: Pectoralis block   Pre-Anesthetic Checklist: ,, timeout performed, Correct Patient, Correct Site, Correct Laterality, Correct Procedure, Correct Position, site marked, Risks and benefits discussed,  Surgical consent,  Pre-op evaluation,  At surgeon's request and post-op pain management  Laterality: Right  Prep: Maximum Sterile Barrier Precautions used, chloraprep       Needles:  Injection technique: Single-shot  Needle Type: Echogenic Stimulator Needle     Needle Length: 10cm      Additional Needles:   Procedures: ultrasound guided,,,,,,,,  Narrative:  Start time: 08/05/2016 8:09 AM End time: 08/05/2016 8:16 AM Injection made incrementally with aspirations every 5 mL.  Performed by: Personally  Anesthesiologist: Montez Hageman  Additional Notes: Risks, benefits and alternative to block explained extensively.  Patient tolerated procedure well, without complications.

## 2016-08-05 NOTE — Progress Notes (Signed)
Nuc med staff performed nuc med inj. No additional sedation required, pt tol well, Will call friend Cecille Rubin back to bedside and update/provide emotional support.

## 2016-08-05 NOTE — Discharge Instructions (Signed)
[facility] - DISCHARGE INSTRUCTIONS TO PATIENT  Activity:  Driving - May drive in 2 or 3 days, if no more than one pain pill per day   Lifting - No lifting more than 15 pounds, then no limits  Wound Care:   Leave the bandage x 2 days, then remove and shower.        No public water (pool, ocean, lake), until follow up in office.  Diet:  As tolerate  Follow up appointment:  Call Dr. Pollie Friar office [facility]) at [phone] for an appointment in 2 to 3 weeks.  Medications and dosages:  Resume your home medications.  You have a prescription for:  Vicodin  Call Dr. Lucia Gaskins or his office  [phone]) if you have:  Temperature greater than 100.4,  Persistent nausea and vomiting,  Severe uncontrolled pain,  Redness, tenderness, or signs of infection (pain, swelling, redness, odor or green/yellow discharge around the site),  Difficulty breathing, headache or visual disturbances,  Any other questions or concerns you may have after discharge.  In an emergency, call 911 or go to an Emergency Department at a nearby hospital.   Post Anesthesia Home Care Instructions  Activity: Get plenty of rest for the remainder of the day. A responsible individual must stay with you for 24 hours following the procedure.  For the next 24 hours, DO NOT: -Drive a car -Paediatric nurse -Drink alcoholic beverages -Take any medication unless instructed by your physician -Make any legal decisions or sign important papers.  Meals: Start with liquid foods such as gelatin or soup. Progress to regular foods as tolerated. Avoid greasy, spicy, heavy foods. If nausea and/or vomiting occur, drink only clear liquids until the nausea and/or vomiting subsides. Call your physician if vomiting continues.  Special Instructions/Symptoms: Your throat may feel dry or sore from the anesthesia or the breathing tube placed in your throat during surgery. If this causes discomfort, gargle  with warm salt water. The discomfort should disappear within 24 hours.  If you had a scopolamine patch placed behind your ear for the management of post- operative nausea and/or vomiting:  1. The medication in the patch is effective for 72 hours, after which it should be removed.  Wrap patch in a tissue and discard in the trash. Wash hands thoroughly with soap and water. 2. You may remove the patch earlier than 72 hours if you experience unpleasant side effects which may include dry mouth, dizziness or visual disturbances. 3. Avoid touching the patch. Wash your hands with soap and water after contact with the patch.

## 2016-08-05 NOTE — H&P (View-Only) (Signed)
Jasmine Blanchard  Location: [facility] Patient #: [id number] DOB: [birth year] Undefined / Language: Cleophus Molt / Race: White Female  History of Present Illness   The patient is a 66 year old female who presents with a complaint of right breast cancer.  The PCP is Dr. Shon Baton.  The patient was referred by Dr. Alfonso Patten. Marcelo Baldy.  The pateint is at the [facility] On[facility] is Drs. [place] and [place]. She is accompanied with her friend - Caroleen Hamman.  She was gets annual mammograms. She had a mammogram on 07/10/2016 at [facility] which showed a new oval asymmetry in the right breast. Shewent back on 07/14/2016 for a biopsy of this area. her mother had breast cancer at the age of 33 and died for unrelated reasons the age of 27. She has no other history of breast cancer. Her last period was 2003. She is not on hormone replacement.  Mammograms: [facility] - UOQ lesion that measures 0.8 cm by US Biopsy: 07/15/2016 ([id number]) - IDC, grade 1, ER - 100%, PR - 95%, Ki67 - 3%, and Her2Neu - neg Family history of breast or ovarian cancer: Mother breast cancer at age 94 On hormone therapy: None  I discussed the options for breast cancer treatment with the patient. The patient is at the [facility], which includes medical oncology and radiation oncology. I discussed the surgical options of lumpectomy vs. mastectomy. If mastectomy, there is the possibility of reconstruction. I discussed the options of lymph node biopsy. The treatment plan depends on the pathologic staging of the tumor and the patient's personal wishes. The risks of surgery include, but are not limited to, bleeding, infection, the need for further surgery, and nerve injury. The patient has been given literature on the treatment of breast cancer. I talked to her about her implants. I don't think that the implants will be involved much in the surgery. She is unsure whether  the implant is subpectoral or not.  Plan: 1) Right breast lumpectomy and right axillary SLNBx, 2) Rad tx, 3) Antihormone tx  Past Medical History: 1. She has bilateral breast implants - She has silicone breast implants placed in 1987 in [place]. These were switched to saline implants about 1995. 2. Smokes 3. She had a neg colonoscopy by Dr. Olevia Perches in 08/05/2012  Social History: Unmarried. She is accompanied with her friend - Caroleen Hamman [facility] daughter teaches my wife Pilates) No children. 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 they do some finances she helps with.   Past Surgical History Conni Slipper, RN; 07/29/2016 7:41 AM) Breast Augmentation  Bilateral. Breast Biopsy  Right. Colon Polyp Removal - Colonoscopy  Oral Surgery   Diagnostic Studies History Conni Slipper, RN; 07/29/2016 7:41 AM) Colonoscopy  1-5 years ago Mammogram  within last year Pap Smear  1-5 years ago  Medication History Conni Slipper, RN; 07/29/2016 7:41 AM) Medications Reconciled  Social History Conni Slipper, RN; 07/29/2016 7:41 AM) Alcohol use  Moderate alcohol use. Caffeine use  Carbonated beverages. No drug use  Tobacco use  Current every day smoker.  Family History Conni Slipper, RN; 07/29/2016 7:41 AM) Arthritis  Mother. Breast Cancer  Mother. Heart Disease  Family Members In General, Father. Heart disease in female family member before age 28  Hypertension  Brother. Thyroid problems  Mother.  Pregnancy / Birth History Conni Slipper, RN; 07/29/2016 7:41 AM) Age at menarche  28 years. Age of menopause  [age] Contraceptive History  Oral contraceptives.  Other Problems (  Conni Slipper, RN; 07/29/2016 7:41 AM) Breast Cancer  Hypercholesterolemia  Lump In Breast  Melanoma  Thyroid Disease     Review of Systems Conni Slipper RN; 07/29/2016 7:41 AM) General Not Present- Appetite Loss, Chills, Fatigue, Fever, Night Sweats, Weight Gain and Weight Loss. Skin Not  Present- Change in Wart/Mole, Dryness, Hives, Jaundice, New Lesions, Non-Healing Wounds, Rash and Ulcer. HEENT Present- Wears glasses/contact lenses. Not Present- Earache, Hearing Loss, Hoarseness, Nose Bleed, Oral Ulcers, Ringing in the Ears, Seasonal Allergies, Sinus Pain, Sore Throat, Visual Disturbances and Yellow Eyes. Respiratory Not Present- Bloody sputum, Chronic Cough, Difficulty Breathing, Snoring and Wheezing. Breast Not Present- Breast Mass, Breast Pain, Nipple Discharge and Skin Changes. Cardiovascular Not Present- Chest Pain, Difficulty Breathing Lying Down, Leg Cramps, Palpitations, Rapid Heart Rate, Shortness of Breath and Swelling of Extremities. Gastrointestinal Not Present- Abdominal Pain, Bloating, Bloody Stool, Change in Bowel Habits, Chronic diarrhea, Constipation, Difficulty Swallowing, Excessive gas, Gets full quickly at meals, Hemorrhoids, Indigestion, Nausea, Rectal Pain and Vomiting. Female Genitourinary Not Present- Frequency, Nocturia, Painful Urination, Pelvic Pain and Urgency. Musculoskeletal Not Present- Back Pain, Joint Pain, Joint Stiffness, Muscle Pain, Muscle Weakness and Swelling of Extremities. Neurological Not Present- Decreased Memory, Fainting, Headaches, Numbness, Seizures, Tingling, Tremor, Trouble walking and Weakness. Psychiatric Not Present- Anxiety, Bipolar, Change in Sleep Pattern, Depression, Fearful and Frequent crying. Endocrine Not Present- Cold Intolerance, Excessive Hunger, Hair Changes, Heat Intolerance, Hot flashes and New Diabetes. Hematology Present- Blood Thinners. Not Present- Easy Bruising, Excessive bleeding, Gland problems, HIV and Persistent Infections.   Physical Exam  General: Thin WFalert and generally healthy appearing. Somewhat anxious. Skin: Inspection and palpation of the skin unremarkable.  Eyes: Conjunctivae white, pupils equal. Face, ears, nose, mouth, and throat: Face - normal. Normal ears and nose. Lips and teeth  normal.  Neck: Supple. No mass. Trachea midline. No thyroid mass.  Lymph Nodes: No supraclavicular or cervical adenopathy. No axillary adenopathy.  Lungs: Normal respiratory effort. Clear to auscultation and symmetric breath sounds. Cardiovascular: Regular rate and rythm. Normal auscultation of the heart. No murmur or rub.  Breasts - Right: Breast somewhat tight with implant - bruise in UOQ. but I do not feel a mass. Left: Breast somewhat tight with implant. no mass.  Abdomen: Soft. No mass. Liver and spleen not palpable. No tenderness. No hernia. Normal bowel sounds. No abdominal scars. Rectal: Not done.  Musculoskeletal/extremities: Normal gait. Good strength and ROM in upper and lower extremities.   Neurologic: Grossly intact to motor and sensory function.   Psychiatric: Has normal mood and affect. Judgement and insight appear normal.   Assessment & Plan  1.  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 Lindi Adie and [place]  Plan:  1) Right breast lumpectomy and right axillary SLNBx,  2) Rad tx,  3) Antihormone tx  2.  SMOKES (F17.200) 3. She has bilateral breast implants - She has silicone breast implants placed in 1987 in [place]. These were switched to saline implants about 1995.    Alphonsa Overall, MD, [facility] Pager: [phone] Office phone:  [phone]

## 2016-08-05 NOTE — Transfer of Care (Signed)
Immediate Anesthesia Transfer of Care Note  Patient: Jasmine Blanchard  Procedure(s) Performed: Procedure(s): RIGHT BREAST LUMPECTOMY WITH RADIOACTIVE SEED AND RIGHT AXILLARY  SENTINEL LYMPH NODE BIOPSY (Right)  Patient Location: PACU  Anesthesia Type:GA combined with regional for post-op pain  Level of Consciousness: sedated  Airway & Oxygen Therapy: Patient Spontanous Breathing and Patient connected to face mask oxygen  Post-op Assessment: Report given to RN and Post -op Vital signs reviewed and stable  Post vital signs: Reviewed and stable  Last Vitals:  Vitals:   08/05/16 0825 08/05/16 1001  BP: 122/68 109/65  Pulse: 89 84  Resp: (!) 9 12  Temp:  36.9 C    Last Pain:  Vitals:   08/05/16 1001  TempSrc:   PainSc: Asleep         Complications: No apparent anesthesia complications

## 2016-08-05 NOTE — Anesthesia Procedure Notes (Addendum)
Procedure Name: LMA Insertion Date/Time: 08/05/2016 8:35 AM Performed by: Maryella Shivers Pre-anesthesia Checklist: Patient identified, Emergency Drugs available, Suction available and Patient being monitored Patient Re-evaluated:Patient Re-evaluated prior to induction Oxygen Delivery Method: Circle system utilized Preoxygenation: Pre-oxygenation with 100% oxygen Induction Type: IV induction Ventilation: Mask ventilation without difficulty LMA: LMA inserted LMA Size: 4.0 Number of attempts: 1 Airway Equipment and Method: Bite block Placement Confirmation: positive ETCO2 Tube secured with: Tape Dental Injury: Teeth and Oropharynx as per pre-operative assessment

## 2016-08-05 NOTE — Anesthesia Postprocedure Evaluation (Signed)
Anesthesia Post Note  Patient: Jasmine Blanchard  Procedure(s) Performed: Procedure(s) (LRB): RIGHT BREAST LUMPECTOMY WITH RADIOACTIVE SEED AND RIGHT AXILLARY  SENTINEL LYMPH NODE BIOPSY (Right)     Patient location during evaluation: PACU Anesthesia Type: General and Regional Level of consciousness: awake and alert Pain management: pain level controlled Vital Signs Assessment: post-procedure vital signs reviewed and stable Respiratory status: spontaneous breathing, nonlabored ventilation, respiratory function stable and patient connected to nasal cannula oxygen Cardiovascular status: blood pressure returned to baseline and stable Postop Assessment: no signs of nausea or vomiting Anesthetic complications: no    Last Vitals:  Vitals:   08/05/16 1030 08/05/16 1100  BP: (!) 149/78 (!) 145/80  Pulse: 85 86  Resp: 12 18  Temp:  36.7 C    Last Pain:  Vitals:   08/05/16 1001  TempSrc:   PainSc: Asleep                 Montez Hageman

## 2016-08-05 NOTE — Progress Notes (Signed)
Assisted Dr. Carignan with right, ultrasound guided, pectoralis block. Side rails up, monitors on throughout procedure. See vital signs in flow sheet. Tolerated Procedure well. 

## 2016-08-05 NOTE — Op Note (Signed)
08/05/2016  10:03 AM  PATIENT:  Jasmine Blanchard DOB: 06-07-1966 MRN: [id number]  PREOP DIAGNOSIS:   Right breast cancer, bilateral breast implants  POSTOP DIAGNOSIS:    Right breast cancer, 10 o'clock position (T1, N0), bilateral breast implants  PROCEDURE:   Procedure(s): RIGHT BREAST LUMPECTOMY WITH RADIOACTIVE SEED AND RIGHT AXILLARY  SENTINEL LYMPH NODE BIOPSY, deep sentinel lymph node biopsy (Single incision)  SURGEON:   Alphonsa Overall, M.D.  ANESTHESIA:   general  Anesthesiologist: Montez Hageman, MD CRNA: Maryella Shivers, CRNA; Marrianne Mood, CRNA  General  EBL:  Minimal  ml  DRAINS:  none   LOCAL MEDICATIONS USED:   10 cc of 1/4% marcaine, right pectoral block  SPECIMEN:   Right breast lumpectomy (6 color paint), inferior margin (suture anterior), right axillary sentinel lymph node (counts - 1,200, background -0)  COUNTS CORRECT:  YES  INDICATIONS FOR PROCEDURE:  Jasmine Blanchard is a 66 y.o. (DOB: 1950-06-05) white female whose primary care physician is Jasmine Baton, MD and comes for right breast lumpectomy and right axillary sentinel lymph node biopsy.   She presented to the Breast [facility] clinic with a newly diagnosed right breast cancer.  She was seen with Drs. [place] and [place].  The options for breast cancer treatment have been discussed with the patient. She elected to proceed with lumpectomy and axillary sentinel lymph node.     The indications and potential complications of surgery were explained to the patient. Potential complications include, but are not limited to, bleeding, infection, the need for further surgery, and nerve injury.     She had a I131 seed placed on 08/04/2016 in her right breast at [facility]  I confirmed the presence of the I131 seed in the pre op area using the Neoprobe.  The seed is in the 10 o'clock position of the right breast.   In the holding area, her right areola was injected with 1 millicurie of Technitium Sulfur Colloid.  OPERATIVE NOTE:    The patient was taken to room # 8 at [facility] where she underwent a general anesthesia  supervised by Anesthesiologist: Montez Hageman, MD CRNA: Maryella Shivers, CRNA; Marrianne Mood, CRNA. Her right breast and axilla were prepped with  ChloraPrep and sterilely draped.    A time-out and the surgical check list was reviewed.    I turned attention to the cancer which was about at the 10 o'clock position of the right breast.   I used the Neoprobe to identify the I131 seed.  The cancer was only about 6 cm from the axilla, so I decided to go through a single incision, starting my incision mid way between the right breast cancer and the right axillary sentinel lymph node.  I tried to excise an area around the tumor of at least 1 cm.    I excised this block of breast tissue approximately 4 cm by 4 cm  in diameter.   She has breast implants which are sub pectoral, I took my lumpectomy down to the pectoralis muscle, but did not encounter the implant capsule.   I painted the lumpectomy specimen with the 6 color paint kit and did a specimen mammogram which confirmed the mass, clip, and the seed were all in the right position in the specimen.  The specimen was sent to pathology who called back to confirm that they have the seed and the specimen.   If the tumor was close, it looked closest to the inferior margin, so  I took some additional inferior margin and labeled it as such.   I then started the right deep axillary sentinel lymph node biopsy. I went through the incision that I made for the lumpectomy.  I found a hot area at the junction of the breast and the pectoralis major muscle, deep in the axilla. I cut down and  identified a hot node that had counts of 1,200 and the background has 0 counts.  I checked her internal mammary nodes and supraclavicular nodes with the neoprobe and found no other hot area. The axillary node was then sent to pathology.    I then irrigated the wound with saline. I  infiltrated approximately 30 mL of 1/4% Marcaine between the incisions. I placed 3 clips to mark biopsy cavity, at 12, 3, and 9 o'clock.  The inferior aspect of the wound opened to the axilla, so there was no "wall" to mark.    I then closed the wound in layers using 3-0 Vicryl sutures for the deep layer. At the skin, I closed the incision with a 4-0 Monocryl suture. The incision was then painted with Dermabond.  She had gauze place over the wound and she was placed in a breast binder.   The patient tolerated the procedure well, was transported to the recovery room in good condition. Sponge and needle count were correct at the end of the case.   Final pathology is pending.   Alphonsa Overall, MD, [facility] Pager: [phone] Office phone:  [phone]

## 2016-08-05 NOTE — Anesthesia Preprocedure Evaluation (Addendum)
Anesthesia Evaluation  Patient identified by MRN, date of birth, ID band Patient awake    Reviewed: Allergy & Precautions, NPO status , Patient's Chart, lab work & pertinent test results  History of Anesthesia Complications (+) PONV  Airway Mallampati: II  TM Distance: >3 FB Neck ROM: Full    Dental no notable dental hx. (+) Caps   Pulmonary Current Smoker,    Pulmonary exam normal breath sounds clear to auscultation       Cardiovascular negative cardio ROS Normal cardiovascular exam Rhythm:Regular Rate:Normal     Neuro/Psych negative neurological ROS  negative psych ROS   GI/Hepatic negative GI ROS, Neg liver ROS,   Endo/Other  negative endocrine ROS  Renal/GU negative Renal ROS  negative genitourinary   Musculoskeletal negative musculoskeletal ROS (+)   Abdominal   Peds negative pediatric ROS (+)  Hematology negative hematology ROS (+)   Anesthesia Other Findings   Reproductive/Obstetrics negative OB ROS                             Anesthesia Physical Anesthesia Plan  ASA: II  Anesthesia Plan: General   Post-op Pain Management: GA combined w/ Regional for post-op pain   Induction: Intravenous  PONV Risk Score and Plan: 3 and Ondansetron, Dexamethasone, Propofol and Midazolam  Airway Management Planned: LMA  Additional Equipment:   Intra-op Plan:   Post-operative Plan: Extubation in OR  Informed Consent: I have reviewed the patients History and Physical, chart, labs and discussed the procedure including the risks, benefits and alternatives for the proposed anesthesia with the patient or authorized representative who has indicated his/her understanding and acceptance.   Dental advisory given  Plan Discussed with: CRNA  Anesthesia Plan Comments:        Anesthesia Quick Evaluation

## 2016-08-05 NOTE — Interval H&P Note (Signed)
History and Physical Interval Note:  08/05/2016 8:08 AM  Jasmine Blanchard  has presented today for surgery, with the diagnosis of Right breast cancer  The various methods of treatment have been discussed with the patient and family.  Friend with her, Achille Rich.  After consideration of risks, benefits and other options for treatment, the patient has consented to  Procedure(s): RIGHT BREAST LUMPECTOMY WITH RADIOACTIVE SEED AND RIGHT AXILLARY  SENTINEL LYMPH NODE BIOPSY (Right) as a surgical intervention .  The patient's history has been reviewed, patient examined, no change in status, stable for surgery.  I have reviewed the patient's chart and labs.  Questions were answered to the patient's satisfaction.     Jun Rightmyer H

## 2016-08-06 ENCOUNTER — Encounter (HOSPITAL_BASED_OUTPATIENT_CLINIC_OR_DEPARTMENT_OTHER): Payer: Self-pay | Admitting: Surgery

## 2016-08-14 ENCOUNTER — Ambulatory Visit (HOSPITAL_BASED_OUTPATIENT_CLINIC_OR_DEPARTMENT_OTHER): Payer: Medicare Other | Admitting: Hematology and Oncology

## 2016-08-14 ENCOUNTER — Encounter: Payer: Self-pay | Admitting: Hematology and Oncology

## 2016-08-14 ENCOUNTER — Telehealth: Payer: Self-pay | Admitting: *Deleted

## 2016-08-14 DIAGNOSIS — Z170 Estrogen receptor positive status [ER+]: Secondary | ICD-10-CM | POA: Diagnosis not present

## 2016-08-14 DIAGNOSIS — C50411 Malignant neoplasm of upper-outer quadrant of right female breast: Secondary | ICD-10-CM

## 2016-08-14 NOTE — Assessment & Plan Note (Signed)
08/05/2016: Right lumpectomy: IDC with DCIS, 1.2 cm, margins negative, 0/1 lymph node negative, grade 1, ER 100%, PR 95%, HER-2 negative ratio 1.34, Ki-67 3%, T1c N0 stage IA  Pathology counseling: I discussed the final pathology report of the patient provided  a copy of this report. I discussed the margins as well as lymph node surgeries. We also discussed the final staging along with previously performed ER/PR and HER-2/neu testing.  Recommendation: 1. Oncotype DX testing to determine if she would benefit from chemotherapy 2. adjuvant radiation therapy followed by 3. Adjuvant antiestrogen therapy with anastrozole 1 mg daily 5 years  Return to clinic based upon Oncotype DX test result

## 2016-08-14 NOTE — Telephone Encounter (Signed)
Ordered oncotype per Dr. Gudena.  Faxed requisition to pathology and confirmed receipt. 

## 2016-08-14 NOTE — Progress Notes (Signed)
Patient Care Team: Shon Baton, MD as PCP - General (Internal Medicine) Alphonsa Overall, MD as Consulting Physician (General Surgery) Nicholas Lose, MD as Consulting Physician (Hematology and Oncology) Kyung Rudd, MD as Consulting Physician (Radiation Oncology)  DIAGNOSIS:  Encounter Diagnosis  Name Primary?  . Malignant neoplasm of upper-outer quadrant of right breast in female, estrogen receptor positive ([place])     SUMMARY OF ONCOLOGIC HISTORY:   Malignant neoplasm of upper-outer quadrant of right breast in female, estrogen receptor positive ([place])   07/15/2016 Initial Diagnosis    Screening detected right breast asymmetry upper outer quadrant 8 mm at 10:00 position axilla negative; ultrasound biopsy grade 1 invasive ductal carcinoma ER 100%, PR 95%, HER-2 negative ratio 1.34, Ki-67 3%, T1 BN 0 stage I a clinical stage      08/05/2016 Surgery    Right lumpectomy: IDC with DCIS, 1.2 cm, margins negative, 0/1 lymph node negative, grade 1, ER 100%, PR 95%, HER-2 negative ratio 1.34, Ki-67 3%, T1c N0 stage IA       CHIEF COMPLIANT: Follow-up of recent right lumpectomy  INTERVAL HISTORY: Jasmine Blanchard is a 66 year old with above-mentioned history right breast cancer underwent lumpectomy and is here to discuss pathology report. She has tolerated the surgery fairly well. Does not have any major pain or discomfort. Mild discomfort underneath the axilla.  REVIEW OF SYSTEMS:   Constitutional: Denies fevers, chills or abnormal weight loss Eyes: Denies blurriness of vision Ears, nose, mouth, throat, and face: Denies mucositis or sore throat Respiratory: Denies cough, dyspnea or wheezes Cardiovascular: Denies palpitation, chest discomfort Gastrointestinal:  Denies nausea, heartburn or change in bowel habits Skin: Denies abnormal skin rashes Lymphatics: Denies new lymphadenopathy or easy bruising Neurological:Denies numbness, tingling or new weaknesses Behavioral/Psych: Mood is stable, no  new changes  Extremities: No lower extremity edema Breast: Right lumpectomy All other systems were reviewed with the patient and are negative.  I have reviewed the past medical history, past surgical history, social history and family history with the patient and they are unchanged from previous note.  ALLERGIES:  has No Known Allergies.  MEDICATIONS:  Current Outpatient Prescriptions  Medication Sig Dispense Refill  . aspirin 81 MG tablet Take 81 mg by mouth daily.    . Biotin 1000 MCG tablet Take 1,000 mcg by mouth 3 (three) times daily.    Marland Kitchen CALCIUM-MAGNESIUM-VITAMIN D PO Take by mouth.    . Coenzyme Q10 (CO Q-10 PO) Take 1 tablet by mouth daily.    . fenofibrate 160 MG tablet Take 160 mg by mouth daily.    Marland Kitchen HYDROcodone-acetaminophen (NORCO/VICODIN) 5-325 MG tablet Take 1-2 tablets by mouth every 6 (six) hours as needed for moderate pain. 20 tablet 0  . Omega-3 Fatty Acids (FISH OIL OMEGA-3 PO) Take by mouth.     No current facility-administered medications for this visit.     PHYSICAL EXAMINATION: ECOG PERFORMANCE STATUS: 1 - Symptomatic but completely ambulatory  Vitals:   08/14/16 0849  BP: (!) 156/84  Pulse: 90  Resp: 20  Temp: 98.8 F (37.1 C)   Filed Weights   08/14/16 0849  Weight: 114 lb 9.6 oz (52 kg)    GENERAL:alert, no distress and comfortable SKIN: skin color, texture, turgor are normal, no rashes or significant lesions EYES: normal, Conjunctiva are pink and non-injected, sclera clear OROPHARYNX:no exudate, no erythema and lips, buccal mucosa, and tongue normal  NECK: supple, thyroid normal size, non-tender, without nodularity LYMPH:  no palpable lymphadenopathy in the cervical, axillary or  inguinal LUNGS: clear to auscultation and percussion with normal breathing effort HEART: regular rate & rhythm and no murmurs and no lower extremity edema ABDOMEN:abdomen soft, non-tender and normal bowel sounds MUSCULOSKELETAL:no cyanosis of digits and no clubbing    NEURO: alert & oriented x 3 with fluent speech, no focal motor/sensory deficits EXTREMITIES: No lower extremity edema  LABORATORY DATA:  I have reviewed the data as listed   Chemistry      Component Value Date/Time   NA 140 07/29/2016 0841   K 4.2 07/29/2016 0841   CO2 25 07/29/2016 0841   BUN 19.9 07/29/2016 0841   CREATININE 0.8 07/29/2016 0841      Component Value Date/Time   CALCIUM 9.8 07/29/2016 0841   ALKPHOS 56 07/29/2016 0841   AST 29 07/29/2016 0841   ALT 24 07/29/2016 0841   BILITOT 0.30 07/29/2016 0841       Lab Results  Component Value Date   WBC 7.0 07/29/2016   HGB 14.5 07/29/2016   HCT 41.4 07/29/2016   MCV 94.0 07/29/2016   PLT 352 07/29/2016   NEUTROABS 4.5 07/29/2016    ASSESSMENT & PLAN:  Malignant neoplasm of upper-outer quadrant of right breast in female, estrogen receptor positive ([place]) 08/05/2016: Right lumpectomy: IDC with DCIS, 1.2 cm, margins negative, 0/1 lymph node negative, grade 1, ER 100%, PR 95%, HER-2 negative ratio 1.34, Ki-67 3%, T1c N0 stage IA  Pathology counseling: I discussed the final pathology report of the patient provided  a copy of this report. I discussed the margins as well as lymph node surgeries. We also discussed the final staging along with previously performed ER/PR and HER-2/neu testing.  Recommendation: 1. Oncotype DX testing to determine if she would benefit from chemotherapy 2. adjuvant radiation therapy followed by 3. Adjuvant antiestrogen therapy with anastrozole 1 mg daily 5 years  Return to clinic based upon Oncotype DX test result  I spent 25 minutes talking to the patient of which more than half was spent in counseling and coordination of care.  No orders of the defined types were placed in this encounter.  The patient has a good understanding of the overall plan. she agrees with it. she will call with any problems that may develop before the next visit here.   Rulon Eisenmenger, MD 08/14/16

## 2016-08-28 ENCOUNTER — Telehealth: Payer: Self-pay | Admitting: Radiation Oncology

## 2016-08-28 ENCOUNTER — Telehealth: Payer: Self-pay | Admitting: *Deleted

## 2016-08-28 DIAGNOSIS — C50911 Malignant neoplasm of unspecified site of right female breast: Secondary | ICD-10-CM

## 2016-08-28 DIAGNOSIS — Z170 Estrogen receptor positive status [ER+]: Principal | ICD-10-CM

## 2016-08-28 NOTE — Telephone Encounter (Signed)
LVM with appt date of 9/24 @ 10, told patient to call back to confirm

## 2016-08-28 NOTE — Telephone Encounter (Signed)
Received oncotype score of 8/6%. Physician team notified. Called pt with results and discussed she will not need chemotherapy. Informed pt she will received a call for an appt to see Dr. Lisbeth Renshaw for xrt. Received verbal understanding. Denies further needs at this time.

## 2016-09-01 ENCOUNTER — Encounter (HOSPITAL_COMMUNITY): Payer: Self-pay

## 2016-09-17 ENCOUNTER — Other Ambulatory Visit: Payer: Self-pay | Admitting: Radiation Oncology

## 2016-09-17 ENCOUNTER — Inpatient Hospital Stay
Admission: RE | Admit: 2016-09-17 | Discharge: 2016-09-17 | Disposition: A | Discharge status: Home / Self Care | Payer: Self-pay | Source: Ambulatory Visit | Attending: Radiation Oncology | Admitting: Radiation Oncology

## 2016-09-17 DIAGNOSIS — C801 Malignant (primary) neoplasm, unspecified: Secondary | ICD-10-CM

## 2016-09-29 ENCOUNTER — Ambulatory Visit (INDEPENDENT_AMBULATORY_CARE_PROVIDER_SITE_OTHER): Payer: Medicare Other | Admitting: Obstetrics and Gynecology

## 2016-09-29 ENCOUNTER — Encounter: Payer: Self-pay | Admitting: Obstetrics and Gynecology

## 2016-09-29 VITALS — BP 116/70 | HR 90 | Resp 16 | Ht 60.5 in | Wt 112.4 lb

## 2016-09-29 DIAGNOSIS — Z01419 Encounter for gynecological examination (general) (routine) without abnormal findings: Secondary | ICD-10-CM | POA: Diagnosis not present

## 2016-09-29 DIAGNOSIS — Z853 Personal history of malignant neoplasm of breast: Secondary | ICD-10-CM

## 2016-09-29 DIAGNOSIS — M810 Age-related osteoporosis without current pathological fracture: Secondary | ICD-10-CM | POA: Diagnosis not present

## 2016-09-29 DIAGNOSIS — Z124 Encounter for screening for malignant neoplasm of cervix: Secondary | ICD-10-CM | POA: Diagnosis not present

## 2016-09-29 DIAGNOSIS — N952 Postmenopausal atrophic vaginitis: Secondary | ICD-10-CM | POA: Diagnosis not present

## 2016-09-29 DIAGNOSIS — F17200 Nicotine dependence, unspecified, uncomplicated: Secondary | ICD-10-CM

## 2016-09-29 NOTE — Progress Notes (Signed)
66 y.o. G0P0000 SingleCaucasianF here for annual exam.  The patient has a h/o right breast cancer, s/p lumpectomy in 7/18. She doesn't need chemotherapy, starts radiation at the end of this month. She noticed some redness on the right breast, starting 10 days ago, she was started on antibiotics and it hasn't made a difference. No fevers, no body aches, no increase in tenderness, no worsening of erythema. She has a f/u with a PA at the surgeons office today.  Not sexually active, no vaginal bleeding.  She has a h/o osteoporosis, was on a bisphosphonate in the past and didn't tolerate them. Primary has discussed options of treatment with her, she has declined to this point. Has f/u DEXA in 2/19. She will also discuss with the Oncologist. She is worried going on anastrozole and the antiestrogen effects on her bone.     Patient's last menstrual period was 12/19/2000 (approximate).          Sexually active: No.  The current method of family planning is post menopausal status.    Exercising: Yes.    yoga and walking Smoker:  Yes, working on quitting, down to 8 cigarettes a day.   Health Maintenance: Pap:  06/2014 WNL per patient  History of abnormal Pap:  no MMG:  09-17-16 DX with breast Cancer Colonoscopy:  08-05-12 Polyps repeat in 5 yrs  BMD:   02/2015 Osteoporosis, another DEXA in 2/19. Followed by her primary TDaP:  PCP, 2013 Gardasil: N/A   reports that she has been smoking Cigarettes.  She has a 20.00 pack-year smoking history. She has never used smokeless tobacco. She reports that she drinks about 6.0 oz of alcohol per week . She reports that she does not use drugs. Semi-retired, attorney.   Past Medical History:  Diagnosis Date  . Breast cancer ([place]) 07/16/2016   right ER/PR positive, grade 1 invasive ductal carcinoma  . Dental crowns present    also caps and dental implants  . Hyperlipidemia   . Immature cataract   . Osteopenia   . PONV (postoperative nausea and vomiting)     Past  Surgical History:  Procedure Laterality Date  . AUGMENTATION MAMMAPLASTY    . BREAST ENHANCEMENT SURGERY  1987; 1995  . BREAST LUMPECTOMY WITH RADIOACTIVE SEED AND SENTINEL LYMPH NODE BIOPSY Right 08/05/2016   Procedure: RIGHT BREAST LUMPECTOMY WITH RADIOACTIVE SEED AND RIGHT AXILLARY  SENTINEL LYMPH NODE BIOPSY;  Surgeon: Alphonsa Overall, MD;  Location: [facility];  Service: General;  Laterality: Right;  . COLONOSCOPY WITH PROPOFOL  08/05/2012    Current Outpatient Prescriptions  Medication Sig Dispense Refill  . aspirin 81 MG tablet Take 81 mg by mouth daily.    . Biotin 1000 MCG tablet Take 1,000 mcg by mouth 3 (three) times daily.    Marland Kitchen CALCIUM-MAGNESIUM-VITAMIN D PO Take by mouth.    . Coenzyme Q10 (CO Q-10 PO) Take 1 tablet by mouth daily.    . fenofibrate 160 MG tablet Take 160 mg by mouth daily.    . Omega-3 Fatty Acids (FISH OIL OMEGA-3 PO) Take by mouth.     No current facility-administered medications for this visit.     Family History  Problem Relation Age of Onset  . Breast cancer Mother   . Heart disease Mother   . Heart attack Father     Review of Systems  Constitutional: Negative.   HENT: Negative.   Eyes: Negative.   Respiratory: Negative.   Cardiovascular: Negative.   Gastrointestinal: Negative.  Endocrine: Negative.   Genitourinary: Negative.   Musculoskeletal: Negative.   Skin: Negative.   Allergic/Immunologic: Negative.   Neurological: Negative.   Psychiatric/Behavioral: Negative.     Exam:   BP 116/70 (BP Location: Left Arm, Patient Position: Sitting, Cuff Size: Normal)   Pulse 90   Resp 16   Ht 5' 0.5" (1.537 m)   Wt 112 lb 6.4 oz (51 kg)   LMP 12/19/2000 (Approximate)   BMI 21.59 kg/m   Weight change: @WEIGHTCHANGE @ Height:   Height: 5' 0.5" (153.7 cm)  Ht Readings from Last 3 Encounters:  09/29/16 5' 0.5" (1.537 m)  08/14/16 5' (1.524 m)  08/05/16 5' (1.524 m)    General appearance: alert, cooperative and appears stated  age Head: Normocephalic, without obvious abnormality, atraumatic Neck: no adenopathy, supple, symmetrical, trachea midline and thyroid normal to inspection and palpation Lungs: clear to auscultation bilaterally Cardiovascular: regular rate and rhythm Breasts: bilateral implants, evidence of right lumpectomy (upper outer quadrant), mild erythema on the lateral and lower right breast, slight induration, no warmth, no lumps Abdomen: soft, non-tender; bowel sounds normal; no masses,  no organomegaly Extremities: extremities normal, atraumatic, no cyanosis or edema Skin: Skin color, texture, turgor normal. No rashes or lesions Lymph nodes: Cervical, supraclavicular, and axillary nodes normal. No abnormal inguinal nodes palpated Neurologic: Grossly normal   Pelvic: External genitalia:  no lesions              Urethra:  normal appearing urethra with no masses, tenderness or lesions              Bartholins and Skenes: normal                 Vagina: atrophic vagina with normal color and discharge, no lesions              Cervix: no lesions and only anterior lip of cervix seen, patient uncomfortable with the pediatric speculum               Bimanual Exam:  Uterus:  normal size, contour, position, consistency, mobility, non-tender              Adnexa: no mass, fullness, tenderness               Rectovaginal: Confirms               Anus:  normal sphincter tone, no lesions  Chaperone was present for exam.  A:  Well Woman with normal exam other than slight erythema on the right breast  Recent right lumpectomy for breast cancer  Vaginal atrophy  Osteoporosis, followed by primary, declined medication  Smoker, working on quitting, down to 8 cigarettes a day  Discussed ETOH use, reviewed guidelines  P:   Pap with hpv  Mammogram and colonoscopy UTD  DEXA in 2/19  Continue with calcium/vit d/exercise  She is f/u with the breast surgeons office today to f/u on the right breast erythema (no change  with antibiotics)  Discussed that sexual activity may be difficult given significant vaginal atrophy, recommended she call if has plans to be sexually active (potential use of dilators, lubricants)   Records from Dr Virgina Jock reviewed

## 2016-09-29 NOTE — Patient Instructions (Signed)

## 2016-09-30 ENCOUNTER — Other Ambulatory Visit (HOSPITAL_COMMUNITY)
Admission: RE | Admit: 2016-09-30 | Discharge: 2016-09-30 | Disposition: A | Discharge status: Home / Self Care | Payer: Medicare Other | Source: Ambulatory Visit | Attending: Obstetrics and Gynecology | Admitting: Obstetrics and Gynecology

## 2016-09-30 DIAGNOSIS — Z124 Encounter for screening for malignant neoplasm of cervix: Secondary | ICD-10-CM | POA: Insufficient documentation

## 2016-09-30 NOTE — Addendum Note (Signed)
Addended by: Dorothy Spark on: 09/30/2016 07:20 PM   Modules accepted: Orders

## 2016-10-02 LAB — CYTOLOGY - PAP
DIAGNOSIS: NEGATIVE
HPV: NOT DETECTED

## 2016-10-05 NOTE — Progress Notes (Signed)
Location of Breast Cancer: Upper-outer quadrant of right breast  Histology per Pathology Report:   Diagnosis 08-05-16 1. Breast, lumpectomy, Right - INVASIVE AND IN SITU DUCTAL CARCINOMA, 1.2 CM - MARGINS NOT INVOLVED. - PREVIOUS BIOPSY SITE AND CLIP. - FIBROCYSTIC CHANGES WITH CALCIFICATIONS. 2. Breast, excision, Right additional inferior margin - BENIGN FIBROFATTY BREAST TISSUE. - NO EVIDENCE OF MALIGNANCY. - FINAL INFERIOR MARGIN NEGATIVE. 3. Lymph node, sentinel, biopsy, Right axillary - ONE BENIGN LYMPH NODE (0/1).  Receptor Status: ER(100% +), PR (95% +), Her2-neu (neg), Ki-(3 %)  Diagnosis 07-15-16 Breast, right, needle core biopsy - INVASIVE DUCTAL CARCINOMA. - SEE COMMENT. Microscopic Comment The carcinoma appears grade 1. A breast prognostic profile will be performed and the results reported separately. The results were called to [facility] on 07/16/16. (JBK:gt. 07/16/16) Receptor Status: ER(100% +), PR (95% +), Her2-neu (neg), Ki-(3 %)   Did patient present with symptoms (if so, please note symptoms) or was this found on screening mammography?: screening mammogram   Past/Anticipated interventions by surgeon, if any: Dr. Alphonsa Overall 08-05-16 RIGHT BREAST LUMPECTOMY WITH RADIOACTIVE SEED AND RIGHT AXILLARY [place], 07-15-16 Breast, right, needle core biopsy  Past/Anticipated interventions by medical oncology, if any: Chemotherapy No,  Adjuvant radiation therapy followed by  Adjuvant antiestrogen therapy with anastrozole 1 mg daily 5 years Oncotype DX testing   Lymphedema issues, if any:  No  Pain issues, if any:  No  SAFETY ISSUES:  Prior radiation? :No  Pacemaker/ICD? :No  Possible current pregnancy?: No  Is the patient on methotrexate? : No Menarche 60   G0 P0    BC6 years   Menopause 23   HRTNo Current Complaints / other details:66 y.o. single woman her mother has a history of breast cancer. Wt Readings from Last 3 Encounters:   10/12/16 114 lb (51.7 kg)  09/29/16 112 lb 6.4 oz (51 kg)  08/14/16 114 lb 9.6 oz (52 kg)  BP 135/80   Pulse 97   Temp 98 F (36.7 C) (Oral)   Resp 18   Ht 5' 0.5" (1.537 m)   Wt 114 lb (51.7 kg)   LMP 12/19/2000 (Approximate)   SpO2 98%   BMI 21.90 kg/m     Georgena Spurling, RN 10/05/2016,3:06 PM

## 2016-10-12 ENCOUNTER — Ambulatory Visit
Admission: RE | Admit: 2016-10-12 | Discharge: 2016-10-12 | Disposition: A | Discharge status: Home / Self Care | Payer: Medicare Other | Source: Ambulatory Visit | Attending: Radiation Oncology | Admitting: Radiation Oncology

## 2016-10-12 ENCOUNTER — Encounter: Payer: Self-pay | Admitting: Radiation Oncology

## 2016-10-12 VITALS — BP 135/80 | HR 97 | Temp 98.0°F | Resp 18 | Ht 60.5 in | Wt 114.0 lb

## 2016-10-12 DIAGNOSIS — Z170 Estrogen receptor positive status [ER+]: Principal | ICD-10-CM

## 2016-10-12 DIAGNOSIS — C50411 Malignant neoplasm of upper-outer quadrant of right female breast: Secondary | ICD-10-CM | POA: Diagnosis not present

## 2016-10-12 DIAGNOSIS — M8580 Other specified disorders of bone density and structure, unspecified site: Secondary | ICD-10-CM | POA: Diagnosis not present

## 2016-10-12 DIAGNOSIS — Z803 Family history of malignant neoplasm of breast: Secondary | ICD-10-CM | POA: Diagnosis not present

## 2016-10-12 DIAGNOSIS — Z7982 Long term (current) use of aspirin: Secondary | ICD-10-CM | POA: Diagnosis not present

## 2016-10-12 DIAGNOSIS — Z510 Encounter for antineoplastic radiation therapy: Secondary | ICD-10-CM | POA: Diagnosis not present

## 2016-10-12 DIAGNOSIS — F17210 Nicotine dependence, cigarettes, uncomplicated: Secondary | ICD-10-CM | POA: Diagnosis not present

## 2016-10-12 NOTE — Progress Notes (Signed)
Radiation Oncology         [phone] ________________________________  Name: DIERA Blanchard MRN: [id number]  Date: 10/12/2016  DOB: May 01, 1950  NF:AOZHY, Jasmine Reichmann, MD  Jasmine Lose, MD     REFERRING PHYSICIAN: Nicholas Lose, MD   DIAGNOSIS: There were no encounter diagnoses.   HISTORY OF PRESENT ILLNESS: Jasmine Blanchard is a 66 y.o. female originally seen in the multidisciplinary breast clinic for a new diagnosis of right breast cancer. She was found on screening mammogram to have a right breast assymmetry. She underwent diagnostic imaging to follow this and this revealed an 8 mm mass at the 10 o'clock position in the upper outer quadrant of the right breast. No adenopathy was noted of the axilla by ultrasound. A biopsy on 07/15/16 of the right breast mass revealed a grade 1, ER/PR positive invasive ductal carcinoma.   She has since undergone a right breast lumpectomy with sentinel lymph node assessment on 08/05/2016. Final pathology revealed a grade 1, 1.2 cm invasive ductal carcinoma with DCIS, she one benign lymph node, and her margins were negative. She comes today to discuss the role for adjuvant radiotherapy. Of note her Oncotype score was 8 indicating low risk, and there is no plan for chemotherapy.  PREVIOUS RADIATION THERAPY: No  PAST MEDICAL HISTORY:  Past Medical History:  Diagnosis Date  . Breast cancer ([place]) 07/16/2016   right ER/PR positive, grade 1 invasive ductal carcinoma  . Dental crowns present    also caps and dental implants  . Hyperlipidemia   . Immature cataract   . Osteopenia   . PONV (postoperative nausea and vomiting)        PAST SURGICAL HISTORY: Past Surgical History:  Procedure Laterality Date  . AUGMENTATION MAMMAPLASTY    . BREAST ENHANCEMENT SURGERY  1987; 1995  . BREAST LUMPECTOMY WITH RADIOACTIVE SEED AND SENTINEL LYMPH NODE BIOPSY Right 08/05/2016   Procedure: RIGHT BREAST LUMPECTOMY WITH RADIOACTIVE SEED AND RIGHT AXILLARY  SENTINEL LYMPH  NODE BIOPSY;  Surgeon: Alphonsa Overall, MD;  Location: [facility];  Service: General;  Laterality: Right;  . COLONOSCOPY WITH PROPOFOL  08/05/2012     FAMILY HISTORY:  Family History  Problem Relation Age of Onset  . Breast cancer Mother   . Heart disease Mother   . Heart attack Father      SOCIAL HISTORY:  reports that she has been smoking Cigarettes.  She has a 20.00 pack-year smoking history. She has never used smokeless tobacco. She reports that she drinks about 6.0 oz of alcohol per week . She reports that she does not use drugs. The patient is single and resides in [place]. She is an Forensic psychologist.    ALLERGIES: Patient has no known allergies.   MEDICATIONS:  Current Outpatient Prescriptions  Medication Sig Dispense Refill  . aspirin 81 MG tablet Take 81 mg by mouth daily.    . Biotin 1000 MCG tablet Take 1,000 mcg by mouth 3 (three) times daily.    Marland Kitchen CALCIUM-MAGNESIUM-VITAMIN D PO Take by mouth.    . Coenzyme Q10 (CO Q-10 PO) Take 1 tablet by mouth daily.    . fenofibrate 160 MG tablet Take 160 mg by mouth daily.    . Omega-3 Fatty Acids (FISH OIL OMEGA-3 PO) Take by mouth.     No current facility-administered medications for this encounter.      REVIEW OF SYSTEMS: On review of systems, the patient reports that she is doing well overall. She denies any chest pain,  shortness of breath, cough, fevers, chills, night sweats, unintended weight changes. She denies any bowel or bladder disturbances, and denies abdominal pain, nausea or vomiting. She denies any new musculoskeletal or joint aches or pains. A complete review of systems is obtained and is otherwise negative.     PHYSICAL EXAM:  Wt Readings from Last 3 Encounters:  09/29/16 112 lb 6.4 oz (51 kg)  08/14/16 114 lb 9.6 oz (52 kg)  08/05/16 113 lb (51.3 kg)   Temp Readings from Last 3 Encounters:  08/14/16 98.8 F (37.1 C) (Oral)  08/05/16 98 F (36.7 C)  07/29/16 98.3 F (36.8 C) (Oral)   BP  Readings from Last 3 Encounters:  09/29/16 116/70  08/14/16 (!) 156/84  08/05/16 (!) 145/80   Pulse Readings from Last 3 Encounters:  09/29/16 90  08/14/16 90  08/05/16 86     In general this is a well appearing Caucasian female in no acute distress. She's alert and oriented x4 and appropriate throughout the examination. Cardiopulmonary assessment is negative for acute distress and she exhibits normal effort.Her right breast is examined and reveals mild erythema circumferentially around the nipple. No warmth or he is noted of the skin, and no evidence of punctate lesion is identified. No separation or cellulitic changes are otherwise noted.    ECOG = 0  0 - Asymptomatic (Fully active, able to carry on all predisease activities without restriction)  1 - Symptomatic but completely ambulatory (Restricted in physically strenuous activity but ambulatory and able to carry out work of a light or sedentary nature. For example, light housework, office work)  2 - Symptomatic, <50% in bed during the day (Ambulatory and capable of all self care but unable to carry out any work activities. Up and about more than 50% of waking hours)  3 - Symptomatic, >50% in bed, but not bedbound (Capable of only limited self-care, confined to bed or chair 50% or more of waking hours)  4 - Bedbound (Completely disabled. Cannot carry on any self-care. Totally confined to bed or chair)  5 - Death   Jasmine Blanchard MM, Creech RH, Tormey DC, et al. [phone]). "Toxicity and response criteria of the [facility]". [place] Oncol. 5 (6): 649-55    LABORATORY DATA:  Lab Results  Component Value Date   WBC 7.0 07/29/2016   HGB 14.5 07/29/2016   HCT 41.4 07/29/2016   MCV 94.0 07/29/2016   PLT 352 07/29/2016   Lab Results  Component Value Date   NA 140 07/29/2016   K 4.2 07/29/2016   CO2 25 07/29/2016   Lab Results  Component Value Date   ALT 24 07/29/2016   AST 29 07/29/2016   ALKPHOS 56  07/29/2016   BILITOT 0.30 07/29/2016      RADIOGRAPHY: No results found.     IMPRESSION/PLAN: 1. Stage IA, pT1cN0M0, grade 1 ER/PR positive invasive ductal carcinoma of the right breast.we reviewed the findings from her pathology, and I discussed with Dr. Lisbeth Renshaw not already reviewed this together. Fortunately there does not appear to be a role for any chemotherapy at this time, and we are ready to move forward with discussion of adjuvant therapy with radiation. We discussed the risks, benefits, short, and long term effects of radiotherapy, and the patient is interested in proceeding. I reviewed the delivery and logistics of radiotherapy, and given her in situ implants, and also reviewed with Dr. Lisbeth Renshaw would still recommend a course of 6 1/2 weeks of treatment. She appears  to be well-healed, and we will plan for her to return for simulation on this Thursday at 10 AM. Written consent is obtained and placed in the chart, a copy was provided to the patient.  In a visit lasting 25 minutes, greater than 50% of the time was spent face to face discussing the logistics of treatment, and coordinating the patient's care.  The above documentation reflects my direct findings during this shared patient visit. Please see the separate note by Dr. Lisbeth Renshaw on this date for the remainder of the patient's plan of care.    Carola Rhine, PAC

## 2016-10-12 NOTE — Addendum Note (Signed)
Encounter addended by: Malena Edman, RN on: 10/12/2016  1:18 PM<BR>    Actions taken: Charge Capture section accepted

## 2016-10-15 ENCOUNTER — Ambulatory Visit
Admission: RE | Admit: 2016-10-15 | Discharge: 2016-10-15 | Disposition: A | Discharge status: Home / Self Care | Payer: Medicare Other | Source: Ambulatory Visit | Attending: Radiation Oncology | Admitting: Radiation Oncology

## 2016-10-15 DIAGNOSIS — Z170 Estrogen receptor positive status [ER+]: Principal | ICD-10-CM

## 2016-10-15 DIAGNOSIS — Z510 Encounter for antineoplastic radiation therapy: Secondary | ICD-10-CM | POA: Diagnosis not present

## 2016-10-15 DIAGNOSIS — C50411 Malignant neoplasm of upper-outer quadrant of right female breast: Secondary | ICD-10-CM

## 2016-10-16 ENCOUNTER — Telehealth: Payer: Self-pay | Admitting: Hematology and Oncology

## 2016-10-16 NOTE — Telephone Encounter (Signed)
Left voicemail for patient regarding the addition of her appt per 9/28 sch msg. Sending her a confirmation letter.

## 2016-10-22 ENCOUNTER — Ambulatory Visit
Admission: RE | Admit: 2016-10-22 | Discharge: 2016-10-22 | Disposition: A | Discharge status: Home / Self Care | Payer: Medicare Other | Source: Ambulatory Visit | Attending: Radiation Oncology | Admitting: Radiation Oncology

## 2016-10-22 DIAGNOSIS — Z510 Encounter for antineoplastic radiation therapy: Secondary | ICD-10-CM | POA: Diagnosis not present

## 2016-10-26 ENCOUNTER — Ambulatory Visit
Admission: RE | Admit: 2016-10-26 | Discharge: 2016-10-26 | Disposition: A | Discharge status: Home / Self Care | Payer: Medicare Other | Source: Ambulatory Visit | Attending: Radiation Oncology | Admitting: Radiation Oncology

## 2016-10-26 DIAGNOSIS — Z170 Estrogen receptor positive status [ER+]: Principal | ICD-10-CM

## 2016-10-26 DIAGNOSIS — Z510 Encounter for antineoplastic radiation therapy: Secondary | ICD-10-CM | POA: Diagnosis not present

## 2016-10-26 DIAGNOSIS — C50411 Malignant neoplasm of upper-outer quadrant of right female breast: Secondary | ICD-10-CM

## 2016-10-26 MED ORDER — RADIAPLEXRX EX GEL
Freq: Once | CUTANEOUS | Status: AC
  Administered 2016-10-26: 13:00:00 via TOPICAL

## 2016-10-26 MED ORDER — ALRA NON-METALLIC DEODORANT (RAD-ONC)
1.0000 "application " | Freq: Once | TOPICAL | Status: AC
  Administered 2016-10-26: 1 via TOPICAL

## 2016-10-26 NOTE — Progress Notes (Addendum)
     Pt education, radiation therapy and you book, alra deodorant,radiaplex gel cream, my business card given, skin irritation, fatigue, breast swelling, pain, increase protein in diet,staty hydrated, lukewarm showers,no rubbing, scrubbing, or scratching area, apply radiaplex after rad tx and bedtime, alra after rad tx and prn, nothing 4 hours prior to treatment, rest and get plenty of sleep, also do some exercise daily, no shaving unless electric, no under wire bras, sees MD weekly and prn,teach back given

## 2016-10-27 ENCOUNTER — Ambulatory Visit
Admission: RE | Admit: 2016-10-27 | Discharge: 2016-10-27 | Disposition: A | Discharge status: Home / Self Care | Payer: Medicare Other | Source: Ambulatory Visit | Attending: Radiation Oncology | Admitting: Radiation Oncology

## 2016-10-27 DIAGNOSIS — C50411 Malignant neoplasm of upper-outer quadrant of right female breast: Secondary | ICD-10-CM | POA: Diagnosis not present

## 2016-10-27 DIAGNOSIS — Z170 Estrogen receptor positive status [ER+]: Secondary | ICD-10-CM | POA: Insufficient documentation

## 2016-10-27 DIAGNOSIS — Z803 Family history of malignant neoplasm of breast: Secondary | ICD-10-CM | POA: Insufficient documentation

## 2016-10-27 DIAGNOSIS — F17210 Nicotine dependence, cigarettes, uncomplicated: Secondary | ICD-10-CM | POA: Insufficient documentation

## 2016-10-27 DIAGNOSIS — Z510 Encounter for antineoplastic radiation therapy: Secondary | ICD-10-CM | POA: Insufficient documentation

## 2016-10-27 DIAGNOSIS — Z7982 Long term (current) use of aspirin: Secondary | ICD-10-CM | POA: Diagnosis not present

## 2016-10-27 DIAGNOSIS — M8580 Other specified disorders of bone density and structure, unspecified site: Secondary | ICD-10-CM | POA: Diagnosis not present

## 2016-10-28 ENCOUNTER — Ambulatory Visit
Admission: RE | Admit: 2016-10-28 | Discharge: 2016-10-28 | Disposition: A | Discharge status: Home / Self Care | Payer: Medicare Other | Source: Ambulatory Visit | Attending: Radiation Oncology | Admitting: Radiation Oncology

## 2016-10-28 DIAGNOSIS — Z510 Encounter for antineoplastic radiation therapy: Secondary | ICD-10-CM | POA: Diagnosis not present

## 2016-10-29 ENCOUNTER — Ambulatory Visit
Admission: RE | Admit: 2016-10-29 | Discharge: 2016-10-29 | Disposition: A | Discharge status: Home / Self Care | Payer: Medicare Other | Source: Ambulatory Visit | Attending: Radiation Oncology | Admitting: Radiation Oncology

## 2016-10-29 DIAGNOSIS — Z510 Encounter for antineoplastic radiation therapy: Secondary | ICD-10-CM | POA: Diagnosis not present

## 2016-10-30 ENCOUNTER — Ambulatory Visit
Admission: RE | Admit: 2016-10-30 | Discharge: 2016-10-30 | Disposition: A | Discharge status: Home / Self Care | Payer: Medicare Other | Source: Ambulatory Visit | Attending: Radiation Oncology | Admitting: Radiation Oncology

## 2016-10-30 DIAGNOSIS — Z510 Encounter for antineoplastic radiation therapy: Secondary | ICD-10-CM | POA: Diagnosis not present

## 2016-11-02 ENCOUNTER — Ambulatory Visit
Admission: RE | Admit: 2016-11-02 | Discharge: 2016-11-02 | Disposition: A | Discharge status: Home / Self Care | Payer: Medicare Other | Source: Ambulatory Visit | Attending: Radiation Oncology | Admitting: Radiation Oncology

## 2016-11-02 DIAGNOSIS — Z510 Encounter for antineoplastic radiation therapy: Secondary | ICD-10-CM | POA: Diagnosis not present

## 2016-11-03 ENCOUNTER — Ambulatory Visit
Admission: RE | Admit: 2016-11-03 | Discharge: 2016-11-03 | Disposition: A | Discharge status: Home / Self Care | Payer: Medicare Other | Source: Ambulatory Visit | Attending: Radiation Oncology | Admitting: Radiation Oncology

## 2016-11-03 DIAGNOSIS — Z510 Encounter for antineoplastic radiation therapy: Secondary | ICD-10-CM | POA: Diagnosis not present

## 2016-11-04 ENCOUNTER — Ambulatory Visit
Admission: RE | Admit: 2016-11-04 | Discharge: 2016-11-04 | Disposition: A | Discharge status: Home / Self Care | Payer: Medicare Other | Source: Ambulatory Visit | Attending: Radiation Oncology | Admitting: Radiation Oncology

## 2016-11-04 DIAGNOSIS — Z510 Encounter for antineoplastic radiation therapy: Secondary | ICD-10-CM | POA: Diagnosis not present

## 2016-11-05 ENCOUNTER — Ambulatory Visit
Admission: RE | Admit: 2016-11-05 | Discharge: 2016-11-05 | Disposition: A | Discharge status: Home / Self Care | Payer: Medicare Other | Source: Ambulatory Visit | Attending: Radiation Oncology | Admitting: Radiation Oncology

## 2016-11-05 DIAGNOSIS — Z510 Encounter for antineoplastic radiation therapy: Secondary | ICD-10-CM | POA: Diagnosis not present

## 2016-11-06 ENCOUNTER — Ambulatory Visit
Admission: RE | Admit: 2016-11-06 | Discharge: 2016-11-06 | Disposition: A | Discharge status: Home / Self Care | Payer: Medicare Other | Source: Ambulatory Visit | Attending: Radiation Oncology | Admitting: Radiation Oncology

## 2016-11-06 DIAGNOSIS — Z510 Encounter for antineoplastic radiation therapy: Secondary | ICD-10-CM | POA: Diagnosis not present

## 2016-11-09 ENCOUNTER — Ambulatory Visit
Admission: RE | Admit: 2016-11-09 | Discharge: 2016-11-09 | Disposition: A | Discharge status: Home / Self Care | Payer: Medicare Other | Source: Ambulatory Visit | Attending: Radiation Oncology | Admitting: Radiation Oncology

## 2016-11-09 DIAGNOSIS — Z510 Encounter for antineoplastic radiation therapy: Secondary | ICD-10-CM | POA: Diagnosis not present

## 2016-11-10 ENCOUNTER — Ambulatory Visit
Admission: RE | Admit: 2016-11-10 | Discharge: 2016-11-10 | Disposition: A | Discharge status: Home / Self Care | Payer: Medicare Other | Source: Ambulatory Visit | Attending: Radiation Oncology | Admitting: Radiation Oncology

## 2016-11-10 DIAGNOSIS — Z510 Encounter for antineoplastic radiation therapy: Secondary | ICD-10-CM | POA: Diagnosis not present

## 2016-11-11 ENCOUNTER — Ambulatory Visit
Admission: RE | Admit: 2016-11-11 | Discharge: 2016-11-11 | Disposition: A | Discharge status: Home / Self Care | Payer: Medicare Other | Source: Ambulatory Visit | Attending: Radiation Oncology | Admitting: Radiation Oncology

## 2016-11-11 DIAGNOSIS — Z510 Encounter for antineoplastic radiation therapy: Secondary | ICD-10-CM | POA: Diagnosis not present

## 2016-11-12 ENCOUNTER — Ambulatory Visit
Admission: RE | Admit: 2016-11-12 | Discharge: 2016-11-12 | Disposition: A | Discharge status: Home / Self Care | Payer: Medicare Other | Source: Ambulatory Visit | Attending: Radiation Oncology | Admitting: Radiation Oncology

## 2016-11-12 DIAGNOSIS — Z510 Encounter for antineoplastic radiation therapy: Secondary | ICD-10-CM | POA: Diagnosis not present

## 2016-11-13 ENCOUNTER — Ambulatory Visit
Admission: RE | Admit: 2016-11-13 | Discharge: 2016-11-13 | Disposition: A | Discharge status: Home / Self Care | Payer: Medicare Other | Source: Ambulatory Visit | Attending: Radiation Oncology | Admitting: Radiation Oncology

## 2016-11-13 DIAGNOSIS — Z510 Encounter for antineoplastic radiation therapy: Secondary | ICD-10-CM | POA: Diagnosis not present

## 2016-11-16 ENCOUNTER — Ambulatory Visit
Admission: RE | Admit: 2016-11-16 | Discharge: 2016-11-16 | Disposition: A | Discharge status: Home / Self Care | Payer: Medicare Other | Source: Ambulatory Visit | Attending: Radiation Oncology | Admitting: Radiation Oncology

## 2016-11-16 DIAGNOSIS — Z510 Encounter for antineoplastic radiation therapy: Secondary | ICD-10-CM | POA: Diagnosis not present

## 2016-11-17 ENCOUNTER — Ambulatory Visit
Admission: RE | Admit: 2016-11-17 | Discharge: 2016-11-17 | Disposition: A | Discharge status: Home / Self Care | Payer: Medicare Other | Source: Ambulatory Visit | Attending: Radiation Oncology | Admitting: Radiation Oncology

## 2016-11-17 DIAGNOSIS — Z510 Encounter for antineoplastic radiation therapy: Secondary | ICD-10-CM | POA: Diagnosis not present

## 2016-11-18 ENCOUNTER — Ambulatory Visit
Admission: RE | Admit: 2016-11-18 | Discharge: 2016-11-18 | Disposition: A | Discharge status: Home / Self Care | Payer: Medicare Other | Source: Ambulatory Visit | Attending: Radiation Oncology | Admitting: Radiation Oncology

## 2016-11-18 DIAGNOSIS — Z510 Encounter for antineoplastic radiation therapy: Secondary | ICD-10-CM | POA: Diagnosis not present

## 2016-11-19 ENCOUNTER — Ambulatory Visit
Admission: RE | Admit: 2016-11-19 | Discharge: 2016-11-19 | Disposition: A | Discharge status: Home / Self Care | Payer: Medicare Other | Source: Ambulatory Visit | Attending: Radiation Oncology | Admitting: Radiation Oncology

## 2016-11-19 DIAGNOSIS — Z510 Encounter for antineoplastic radiation therapy: Secondary | ICD-10-CM | POA: Diagnosis not present

## 2016-11-20 ENCOUNTER — Ambulatory Visit
Admission: RE | Admit: 2016-11-20 | Discharge: 2016-11-20 | Disposition: A | Discharge status: Home / Self Care | Payer: Medicare Other | Source: Ambulatory Visit | Attending: Radiation Oncology | Admitting: Radiation Oncology

## 2016-11-20 DIAGNOSIS — Z510 Encounter for antineoplastic radiation therapy: Secondary | ICD-10-CM | POA: Diagnosis not present

## 2016-11-23 ENCOUNTER — Ambulatory Visit
Admission: RE | Admit: 2016-11-23 | Discharge: 2016-11-23 | Disposition: A | Discharge status: Home / Self Care | Payer: Medicare Other | Source: Ambulatory Visit | Attending: Radiation Oncology | Admitting: Radiation Oncology

## 2016-11-23 DIAGNOSIS — Z510 Encounter for antineoplastic radiation therapy: Secondary | ICD-10-CM | POA: Diagnosis not present

## 2016-11-24 ENCOUNTER — Ambulatory Visit
Admission: RE | Admit: 2016-11-24 | Discharge: 2016-11-24 | Disposition: A | Discharge status: Home / Self Care | Payer: Medicare Other | Source: Ambulatory Visit | Attending: Radiation Oncology | Admitting: Radiation Oncology

## 2016-11-24 DIAGNOSIS — Z510 Encounter for antineoplastic radiation therapy: Secondary | ICD-10-CM | POA: Diagnosis not present

## 2016-11-25 ENCOUNTER — Ambulatory Visit
Admission: RE | Admit: 2016-11-25 | Discharge: 2016-11-25 | Disposition: A | Discharge status: Home / Self Care | Payer: Medicare Other | Source: Ambulatory Visit | Attending: Radiation Oncology | Admitting: Radiation Oncology

## 2016-11-25 DIAGNOSIS — Z510 Encounter for antineoplastic radiation therapy: Secondary | ICD-10-CM | POA: Diagnosis not present

## 2016-11-26 ENCOUNTER — Ambulatory Visit
Admission: RE | Admit: 2016-11-26 | Discharge: 2016-11-26 | Disposition: A | Discharge status: Home / Self Care | Payer: Medicare Other | Source: Ambulatory Visit | Attending: Radiation Oncology | Admitting: Radiation Oncology

## 2016-11-26 DIAGNOSIS — Z510 Encounter for antineoplastic radiation therapy: Secondary | ICD-10-CM | POA: Diagnosis not present

## 2016-11-27 ENCOUNTER — Ambulatory Visit: Payer: Medicare Other | Admitting: Radiation Oncology

## 2016-11-27 ENCOUNTER — Ambulatory Visit
Admission: RE | Admit: 2016-11-27 | Discharge: 2016-11-27 | Disposition: A | Discharge status: Home / Self Care | Payer: Medicare Other | Source: Ambulatory Visit | Attending: Radiation Oncology | Admitting: Radiation Oncology

## 2016-11-27 DIAGNOSIS — Z510 Encounter for antineoplastic radiation therapy: Secondary | ICD-10-CM | POA: Diagnosis not present

## 2016-11-30 ENCOUNTER — Ambulatory Visit
Admission: RE | Admit: 2016-11-30 | Discharge: 2016-11-30 | Disposition: A | Discharge status: Home / Self Care | Payer: Medicare Other | Source: Ambulatory Visit | Attending: Radiation Oncology | Admitting: Radiation Oncology

## 2016-11-30 ENCOUNTER — Ambulatory Visit: Payer: Medicare Other | Admitting: Hematology and Oncology

## 2016-11-30 ENCOUNTER — Telehealth: Payer: Self-pay | Admitting: Hematology and Oncology

## 2016-11-30 ENCOUNTER — Ambulatory Visit (HOSPITAL_BASED_OUTPATIENT_CLINIC_OR_DEPARTMENT_OTHER): Payer: Medicare Other | Admitting: Hematology and Oncology

## 2016-11-30 DIAGNOSIS — F17210 Nicotine dependence, cigarettes, uncomplicated: Secondary | ICD-10-CM

## 2016-11-30 DIAGNOSIS — C50411 Malignant neoplasm of upper-outer quadrant of right female breast: Secondary | ICD-10-CM

## 2016-11-30 DIAGNOSIS — Z510 Encounter for antineoplastic radiation therapy: Secondary | ICD-10-CM | POA: Diagnosis not present

## 2016-11-30 DIAGNOSIS — Z923 Personal history of irradiation: Secondary | ICD-10-CM

## 2016-11-30 DIAGNOSIS — Z170 Estrogen receptor positive status [ER+]: Secondary | ICD-10-CM | POA: Diagnosis not present

## 2016-11-30 NOTE — Telephone Encounter (Signed)
Gave patient avs report and appointments for February.  °

## 2016-11-30 NOTE — Assessment & Plan Note (Signed)
08/05/2016: Right lumpectomy: IDC with DCIS, 1.2 cm, margins negative, 0/1 lymph node negative, grade 1, ER 100%, PR 95%, HER-2 negative ratio 1.34, Ki-67 3%, T1c N0 stage IA  Oncotype DX score 8: 10-year risk of distant recurrence with tamoxifen alone 6%  Adjuvant radiation therapy 10/26/2016-11/30/2016  Treatment plan: Adjuvant antiestrogen therapy with anastrozole 1 mg daily times 5 years  Return to clinic in 3 months for survivorship care plan was

## 2016-11-30 NOTE — Progress Notes (Signed)
Patient Care Team: Shon Baton, MD as PCP - General (Internal Medicine) Alphonsa Overall, MD as Consulting Physician (General Surgery) Nicholas Lose, MD as Consulting Physician (Hematology and Oncology) Kyung Rudd, MD as Consulting Physician (Radiation Oncology)  DIAGNOSIS:  Encounter Diagnosis  Name Primary?  . Malignant neoplasm of upper-outer quadrant of right breast in female, estrogen receptor positive ([place])     SUMMARY OF ONCOLOGIC HISTORY:   Malignant neoplasm of upper-outer quadrant of right breast in female, estrogen receptor positive ([place])   07/15/2016 Initial Diagnosis    Screening detected right breast asymmetry upper outer quadrant 8 mm at 10:00 position axilla negative; ultrasound biopsy grade 1 invasive ductal carcinoma ER 100%, PR 95%, HER-2 negative ratio 1.34, Ki-67 3%, T1 BN 0 stage I a clinical stage      08/05/2016 Surgery    Right lumpectomy: IDC with DCIS, 1.2 cm, margins negative, 0/1 lymph node negative, grade 1, ER 100%, PR 95%, HER-2 negative ratio 1.34, Ki-67 3%, T1c N0 stage IA      08/27/2016 Oncotype testing    Oncotype DX score 8: 6% RR      10/26/2016 - 11/30/2016 Radiation Therapy    Adjuvant radiation therapy       CHIEF COMPLIANT: Follow-up on radiation  INTERVAL HISTORY: Jasmine Blanchard is a 66 year old with above-mentioned history of right breast cancer treated with lumpectomy and currently undergoing radiation.  She will finish radiation next week.  She is here to discuss the role of adjuvant antiestrogen therapy.  REVIEW OF SYSTEMS:   Constitutional: Denies fevers, chills or abnormal weight loss Eyes: Denies blurriness of vision Ears, nose, mouth, throat, and face: Denies mucositis or sore throat Respiratory: Denies cough, dyspnea or wheezes Cardiovascular: Denies palpitation, chest discomfort Gastrointestinal:  Denies nausea, heartburn or change in bowel habits Skin: Denies abnormal skin rashes Lymphatics: Denies new lymphadenopathy  or easy bruising Neurological:Denies numbness, tingling or new weaknesses Behavioral/Psych: Mood is stable, no new changes  Extremities: No lower extremity edema Breast: Mild radiation dermatitis All other systems were reviewed with the patient and are negative.  I have reviewed the past medical history, past surgical history, social history and family history with the patient and they are unchanged from previous note.  ALLERGIES:  has No Known Allergies.  MEDICATIONS:  Current Outpatient Medications  Medication Sig Dispense Refill  . aspirin 81 MG tablet Take 81 mg by mouth daily.    . Biotin 1000 MCG tablet Take 1,000 mcg by mouth 3 (three) times daily.    Marland Kitchen CALCIUM-MAGNESIUM-VITAMIN D PO Take by mouth.    . Coenzyme Q10 (CO Q-10 PO) Take 1 tablet by mouth daily.    . fenofibrate 160 MG tablet Take 160 mg by mouth daily.    . hyaluronate sodium (RADIAPLEXRX) GEL Apply 1 application topically 2 (two) times daily. Apply to breast area after rad tx and bedtime    . non-metallic deodorant (ALRA) MISC Apply 1 application topically.    . Omega-3 Fatty Acids (FISH OIL OMEGA-3 PO) Take by mouth.     No current facility-administered medications for this visit.     PHYSICAL EXAMINATION: ECOG PERFORMANCE STATUS: 1 - Symptomatic but completely ambulatory  Vitals:   11/30/16 1353  BP: (!) 158/85  Pulse: 97  Resp: 20  Temp: 98.4 F (36.9 C)  SpO2: 100%   Filed Weights   11/30/16 1353  Weight: 113 lb 4.8 oz (51.4 kg)    GENERAL:alert, no distress and comfortable SKIN: skin color, texture, turgor  are normal, no rashes or significant lesions EYES: normal, Conjunctiva are pink and non-injected, sclera clear OROPHARYNX:no exudate, no erythema and lips, buccal mucosa, and tongue normal  NECK: supple, thyroid normal size, non-tender, without nodularity LYMPH:  no palpable lymphadenopathy in the cervical, axillary or inguinal LUNGS: clear to auscultation and percussion with normal  breathing effort HEART: regular rate & rhythm and no murmurs and no lower extremity edema ABDOMEN:abdomen soft, non-tender and normal bowel sounds MUSCULOSKELETAL:no cyanosis of digits and no clubbing  NEURO: alert & oriented x 3 with fluent speech, no focal motor/sensory deficits EXTREMITIES: No lower extremity edema  LABORATORY DATA:  I have reviewed the data as listed   Chemistry      Component Value Date/Time   NA 140 07/29/2016 0841   K 4.2 07/29/2016 0841   CO2 25 07/29/2016 0841   BUN 19.9 07/29/2016 0841   CREATININE 0.8 07/29/2016 0841      Component Value Date/Time   CALCIUM 9.8 07/29/2016 0841   ALKPHOS 56 07/29/2016 0841   AST 29 07/29/2016 0841   ALT 24 07/29/2016 0841   BILITOT 0.30 07/29/2016 0841       Lab Results  Component Value Date   WBC 7.0 07/29/2016   HGB 14.5 07/29/2016   HCT 41.4 07/29/2016   MCV 94.0 07/29/2016   PLT 352 07/29/2016   NEUTROABS 4.5 07/29/2016    ASSESSMENT & PLAN:  Malignant neoplasm of upper-outer quadrant of right breast in female, estrogen receptor positive ([place]) 08/05/2016: Right lumpectomy: IDC with DCIS, 1.2 cm, margins negative, 0/1 lymph node negative, grade 1, ER 100%, PR 95%, HER-2 negative ratio 1.34, Ki-67 3%, T1c N0 stage IA  Oncotype DX score 8: 10-year risk of distant recurrence with tamoxifen alone 6%  Adjuvant radiation therapy 10/26/2016-11/30/2016  Treatment plan: Adjuvant antiestrogen therapy with anastrozole 1 mg daily times 5 years Patient wants to wait until February when she is planning to get a bone density test.  Depending on the bone density test she plans to decide on which antiestrogen therapy she will take.  If she has profound osteoporosis then we may To consider tamoxifen therapy.  However because of her tobacco abuse she is concerned about her risk of blood clots.  Although she is trying to cut down his cigarettes and is now down to 8 cigarettes/day.  Return to clinic in 3 months for a follow-up  visit to review bone density test and to initiate antiestrogen therapy  I spent 25 minutes talking to the patient of which more than half was spent in counseling and coordination of care.  No orders of the defined types were placed in this encounter.  The patient has a good understanding of the overall plan. she agrees with it. she will call with any problems that may develop before the next visit here.   Rulon Eisenmenger, MD 11/30/16

## 2016-12-01 ENCOUNTER — Ambulatory Visit: Payer: Medicare Other | Admitting: Radiation Oncology

## 2016-12-01 ENCOUNTER — Ambulatory Visit
Admission: RE | Admit: 2016-12-01 | Discharge: 2016-12-01 | Disposition: A | Discharge status: Home / Self Care | Payer: Medicare Other | Source: Ambulatory Visit | Attending: Radiation Oncology | Admitting: Radiation Oncology

## 2016-12-01 DIAGNOSIS — Z510 Encounter for antineoplastic radiation therapy: Secondary | ICD-10-CM | POA: Diagnosis not present

## 2016-12-01 NOTE — Progress Notes (Signed)
  Radiation Oncology         [phone] ________________________________  Name: Jasmine Blanchard MRN: [id number]  Date: 10/15/2016  DOB: [birth year]  DIAGNOSIS:     ICD-10-CM   1. Malignant neoplasm of upper-outer quadrant of right breast in female, estrogen receptor positive ([place]) C50.411    Z17.0      SIMULATION AND TREATMENT PLANNING NOTE  The patient presented for simulation prior to beginning her course of radiation treatment for her diagnosis of right-sided breast cancer. The patient was placed in a supine position on a breast board. A customized vac-lock bag was constructed and this complex treatment device will be used on a daily basis during her treatment. In this fashion, a CT scan was obtained through the chest area and an isocenter was placed near the chest wall within the breast.  The patient will be planned to receive a course of radiation initially to a dose of 50.4 Gy. This will consist of a whole breast radiotherapy technique. To accomplish this, 2 customized blocks have been designed which will correspond to medial and lateral whole breast tangent fields. This treatment will be accomplished at 1.8 Gy per fraction. A forward planning technique will also be evaluated to determine if this approach improves the plan. It is anticipated that the patient will then receive a 10 Gy boost to the seroma cavity which has been contoured. This will be accomplished at 2 Gy per fraction.   This initial treatment will consist of a 3-D conformal technique. The seroma has been contoured as the primary target structure. Additionally, dose volume histograms of both this target as well as the lungs and heart will also be evaluated. Such an approach is necessary to ensure that the target area is adequately covered while the nearby critical  normal structures are adequately spared.  Plan:  The final anticipated total dose therefore will correspond to 60.4  Gy.    _______________________________   Jodelle Gross, MD, PhD

## 2016-12-01 NOTE — Progress Notes (Signed)
  Radiation Oncology         [phone] ________________________________  Name: Jasmine Blanchard MRN: [id number]  Date: 10/15/2016  DOB: 03-14-1950  Optical Surface Tracking Plan:  Since intensity modulated radiotherapy (IMRT) and 3D conformal radiation treatment methods are predicated on accurate and precise positioning for treatment, intrafraction motion monitoring is medically necessary to ensure accurate and safe treatment delivery.  The ability to quantify intrafraction motion without excessive ionizing radiation dose can only be performed with optical surface tracking. Accordingly, surface imaging offers the opportunity to obtain 3D measurements of patient position throughout IMRT and 3D treatments without excessive radiation exposure.  I am ordering optical surface tracking for this patient's upcoming course of radiotherapy. ________________________________  Kyung Rudd, MD 12/01/2016 6:16 PM    Reference:   Particia Jasper, et al. Surface imaging-based analysis of intrafraction motion for breast radiotherapy patients.Journal of [place], n. 6, nov. 2014. ISSN [id number].   Available at: <http://www.jacmp.org/index.php/jacmp/article/view/4957>.

## 2016-12-02 ENCOUNTER — Ambulatory Visit
Admission: RE | Admit: 2016-12-02 | Discharge: 2016-12-02 | Disposition: A | Discharge status: Home / Self Care | Payer: Medicare Other | Source: Ambulatory Visit | Attending: Radiation Oncology | Admitting: Radiation Oncology

## 2016-12-02 DIAGNOSIS — Z510 Encounter for antineoplastic radiation therapy: Secondary | ICD-10-CM | POA: Diagnosis not present

## 2016-12-03 ENCOUNTER — Ambulatory Visit
Admission: RE | Admit: 2016-12-03 | Discharge: 2016-12-03 | Disposition: A | Discharge status: Home / Self Care | Payer: Medicare Other | Source: Ambulatory Visit | Attending: Radiation Oncology | Admitting: Radiation Oncology

## 2016-12-03 DIAGNOSIS — Z510 Encounter for antineoplastic radiation therapy: Secondary | ICD-10-CM | POA: Diagnosis not present

## 2016-12-04 ENCOUNTER — Ambulatory Visit
Admission: RE | Admit: 2016-12-04 | Discharge: 2016-12-04 | Disposition: A | Discharge status: Home / Self Care | Payer: Medicare Other | Source: Ambulatory Visit | Attending: Radiation Oncology | Admitting: Radiation Oncology

## 2016-12-04 DIAGNOSIS — Z510 Encounter for antineoplastic radiation therapy: Secondary | ICD-10-CM | POA: Diagnosis not present

## 2016-12-07 ENCOUNTER — Ambulatory Visit
Admission: RE | Admit: 2016-12-07 | Discharge: 2016-12-07 | Disposition: A | Discharge status: Home / Self Care | Payer: Medicare Other | Source: Ambulatory Visit | Attending: Radiation Oncology | Admitting: Radiation Oncology

## 2016-12-07 DIAGNOSIS — Z510 Encounter for antineoplastic radiation therapy: Secondary | ICD-10-CM | POA: Diagnosis not present

## 2016-12-08 ENCOUNTER — Ambulatory Visit
Admission: RE | Admit: 2016-12-08 | Discharge: 2016-12-08 | Disposition: A | Discharge status: Home / Self Care | Payer: Medicare Other | Source: Ambulatory Visit | Attending: Radiation Oncology | Admitting: Radiation Oncology

## 2016-12-08 DIAGNOSIS — Z510 Encounter for antineoplastic radiation therapy: Secondary | ICD-10-CM | POA: Diagnosis not present

## 2016-12-09 ENCOUNTER — Ambulatory Visit
Admission: RE | Admit: 2016-12-09 | Discharge: 2016-12-09 | Disposition: A | Discharge status: Home / Self Care | Payer: Medicare Other | Source: Ambulatory Visit | Attending: Radiation Oncology | Admitting: Radiation Oncology

## 2016-12-09 ENCOUNTER — Encounter: Payer: Self-pay | Admitting: Radiation Oncology

## 2016-12-09 DIAGNOSIS — Z510 Encounter for antineoplastic radiation therapy: Secondary | ICD-10-CM | POA: Diagnosis not present

## 2016-12-14 NOTE — Progress Notes (Signed)
  Radiation Oncology         [phone] ________________________________  Name: Jasmine Blanchard MRN: [id number]  Date: 12/09/2016  DOB: 08-10-1950  End of Treatment Note  Diagnosis:   Right-sided breast cancer     Indication for treatment:  Curative       Radiation treatment dates:   10/26/2016 - 12/09/2016  Site/dose:   The patient initially received a dose of 50.4 Gy in 28 fractions to the breast using whole-breast tangent fields. This was delivered using a 3-D conformal technique. The patient then received a boost to the seroma. This delivered an additional 10 Gy in 5 fractions using a 3-D technique. The total dose was 60.4 Gy.  Narrative: The patient tolerated radiation treatment relatively well.   The patient had some expected skin irritation as she progressed during treatment. Moist desquamation was not present at the end of treatment.  Plan: The patient has completed radiation treatment. The patient will return to radiation oncology clinic for routine followup in one month. I advised the patient to call or return sooner if they have any questions or concerns related to their recovery or treatment. ________________________________  Jodelle Gross, MD, PhD  This document serves as a record of services personally performed by Kyung Rudd, MD. It was created on his behalf by Rae Lips, a trained medical scribe. The creation of this record is based on the scribe's personal observations and the provider's statements to them. This document has been checked and approved by the attending provider.

## 2017-01-19 HISTORY — PX: COLONOSCOPY: SHX174

## 2017-01-26 ENCOUNTER — Ambulatory Visit
Admission: RE | Admit: 2017-01-26 | Discharge: 2017-01-26 | Disposition: A | Discharge status: Home / Self Care | Payer: Medicare Other | Source: Ambulatory Visit | Attending: Radiation Oncology | Admitting: Radiation Oncology

## 2017-01-26 ENCOUNTER — Other Ambulatory Visit: Payer: Self-pay

## 2017-01-26 ENCOUNTER — Encounter: Payer: Self-pay | Admitting: Radiation Oncology

## 2017-01-26 VITALS — BP 132/92 | Temp 98.3°F | Resp 20 | Wt 113.0 lb

## 2017-01-26 DIAGNOSIS — Z7982 Long term (current) use of aspirin: Secondary | ICD-10-CM | POA: Diagnosis not present

## 2017-01-26 DIAGNOSIS — C50911 Malignant neoplasm of unspecified site of right female breast: Secondary | ICD-10-CM | POA: Insufficient documentation

## 2017-01-26 DIAGNOSIS — Z79899 Other long term (current) drug therapy: Secondary | ICD-10-CM | POA: Diagnosis not present

## 2017-01-26 DIAGNOSIS — Z923 Personal history of irradiation: Secondary | ICD-10-CM | POA: Insufficient documentation

## 2017-01-26 DIAGNOSIS — Z170 Estrogen receptor positive status [ER+]: Secondary | ICD-10-CM | POA: Diagnosis not present

## 2017-01-26 DIAGNOSIS — C50411 Malignant neoplasm of upper-outer quadrant of right female breast: Secondary | ICD-10-CM

## 2017-01-26 NOTE — Progress Notes (Signed)
  Radiation Oncology         [phone] ________________________________  Name: Jasmine Blanchard MRN: [id number]  Date of Service: 01/26/2017  DOB: 1950/11/06  Post Treatment Note  CC: Shon Baton, MD  Shon Baton, MD  Diagnosis:   Stage IA, pT1cN0M0, grade 1 ER/PR positive invasive ductal carcinoma of the right breast.  Interval Since Last Radiation:  7 weeks   10/26/2016 - 12/09/2016: The patient initially received a dose of 50.4 Gy in 28 fractions to the breast using whole-breast tangent fields. This was delivered using a 3-D conformal technique. The patient then received a boost to the seroma. This delivered an additional 10 Gy in 5 fractions using a 3-D technique. The total dose was 60.4 Gy.    Narrative:  The patient returns today for routine follow-up. During treatment she did very well with radiotherapy and did not have significant desquamation.                             On review of systems, the patient states she is doing well overall. She is using Vitamin E oil for her skin. She is pleased with how her skin looks, and reports that she is able to perform yoga, but occasionally has a pulling sensation in her right axilla. She denies any pain or edema of the RUE.  ALLERGIES:  has No Known Allergies.  Meds: Current Outpatient Medications  Medication Sig Dispense Refill  . aspirin 81 MG tablet Take 81 mg by mouth daily.    . Biotin 1000 MCG tablet Take 1,000 mcg by mouth 3 (three) times daily.    Marland Kitchen CALCIUM-MAGNESIUM-VITAMIN D PO Take by mouth.    . Coenzyme Q10 (CO Q-10 PO) Take 1 tablet by mouth daily.    . fenofibrate 160 MG tablet Take 160 mg by mouth daily.    . Omega-3 Fatty Acids (FISH OIL OMEGA-3 PO) Take by mouth.     No current facility-administered medications for this encounter.     Physical Findings:  weight is 113 lb (51.3 kg). Her oral temperature is 98.3 F (36.8 C). Her blood pressure is 132/92 (abnormal). Her respiration is 20 and oxygen saturation is  100%.    In general this is a well appearing caucasian female in no acute distress. She's alert and oriented x4 and appropriate throughout the examination. Cardiopulmonary assessment is negative for acute distress and she exhibits normal effort. The right breast was examined and reveals only mild hyperpigmentation without desquamation.   Lab Findings: Lab Results  Component Value Date   WBC 7.0 07/29/2016   HGB 14.5 07/29/2016   HCT 41.4 07/29/2016   MCV 94.0 07/29/2016   PLT 352 07/29/2016     Radiographic Findings: No results found.  Impression/Plan: 1. Stage IA, pT1cN0M0, grade 1 ER/PR positive invasive ductal carcinoma of the right breast. The patient has been doing well since completion of radiotherapy. We discussed that we would be happy to continue to follow her as needed, but she will also continue to follow up with Dr. Lindi Adie in medical oncology. She was counseled on skin care as well as measures to avoid sun exposure to this area.  2. Survivorship. She will follow up with medical oncology and be referred for survivorship clinic when apprpriate.      Carola Rhine, PAC

## 2017-03-18 ENCOUNTER — Telehealth: Payer: Self-pay | Admitting: Hematology and Oncology

## 2017-03-18 ENCOUNTER — Inpatient Hospital Stay: Payer: Medicare Other | Attending: Hematology and Oncology | Admitting: Hematology and Oncology

## 2017-03-18 DIAGNOSIS — C50411 Malignant neoplasm of upper-outer quadrant of right female breast: Secondary | ICD-10-CM

## 2017-03-18 DIAGNOSIS — M8580 Other specified disorders of bone density and structure, unspecified site: Secondary | ICD-10-CM | POA: Diagnosis not present

## 2017-03-18 DIAGNOSIS — Z170 Estrogen receptor positive status [ER+]: Secondary | ICD-10-CM | POA: Diagnosis not present

## 2017-03-18 DIAGNOSIS — Z79899 Other long term (current) drug therapy: Secondary | ICD-10-CM

## 2017-03-18 DIAGNOSIS — Z923 Personal history of irradiation: Secondary | ICD-10-CM

## 2017-03-18 DIAGNOSIS — Z7982 Long term (current) use of aspirin: Secondary | ICD-10-CM | POA: Diagnosis not present

## 2017-03-18 DIAGNOSIS — Z79810 Long term (current) use of selective estrogen receptor modulators (SERMs): Secondary | ICD-10-CM | POA: Diagnosis not present

## 2017-03-18 MED ORDER — TAMOXIFEN CITRATE 20 MG PO TABS: 20.0000 mg | ORAL_TABLET | Freq: Every day | ORAL | 0 refills | Status: DC

## 2017-03-18 NOTE — Assessment & Plan Note (Signed)
08/05/2016:Right lumpectomy: IDC with DCIS, 1.2 cm, margins negative, 0/1 lymph node negative, grade 1, ER 100%, PR 95%, HER-2 negative ratio 1.34, Ki-67 3%, T1c N0 stage IA  Oncotype DX score 8: 10-year risk of distant recurrence with tamoxifen alone 6%  Adjuvant radiation therapy 10/26/2016-11/30/2016  Treatment plan: Adjuvant antiestrogen therapy with anastrozole 1 mg daily times 5 years Patient wanted to wait until bone density is done so she can decide between tamoxifen versus anastrozole.  Tobacco abuse: Cutting down

## 2017-03-18 NOTE — Progress Notes (Signed)
 Patient Care Team: Russo, John, MD as PCP - General (Internal Medicine) Newman, David, MD as Consulting Physician (General Surgery) Gudena, Vinay, MD as Consulting Physician (Hematology and Oncology) Moody, John, MD as Consulting Physician (Radiation Oncology)  DIAGNOSIS:  Encounter Diagnosis  Name Primary?  . Malignant neoplasm of upper-outer quadrant of right breast in female, estrogen receptor positive ([facility])     SUMMARY OF ONCOLOGIC HISTORY:   Malignant neoplasm of upper-outer quadrant of right breast in female, estrogen receptor positive ([facility])   07/15/2016 Initial Diagnosis    Screening detected right breast asymmetry upper outer quadrant 8 mm at 10:00 position axilla negative; ultrasound biopsy grade 1 invasive ductal carcinoma ER 100%, PR 95%, HER-2 negative ratio 1.34, Ki-67 3%, T1 BN 0 stage I a clinical stage      08/05/2016 Surgery    Right lumpectomy: IDC with DCIS, 1.2 cm, margins negative, 0/1 lymph node negative, grade 1, ER 100%, PR 95%, HER-2 negative ratio 1.34, Ki-67 3%, T1c N0 stage IA      08/27/2016 Oncotype testing    Oncotype DX score 8: 6% RR      10/26/2016 - 11/30/2016 Radiation Therapy    Adjuvant radiation therapy       CHIEF COMPLIANT: Patient is here to discuss antiestrogen treatment options  INTERVAL HISTORY: Jasmine Blanchard is a 66-year-old with above-mentioned history of right lumpectomy who underwent radiation therapy and is here today to discuss the pros and cons of receiving antiestrogen therapy.  She had a bone density test which showed a T score of -2.4 suggesting severe osteopenia.  She is contemplating on whether or not she wants to take antiestrogen therapy particularly with tamoxifen.  She does not want to take anastrozole or letrozole primarily because of risk of further bone loss.  She is anxious about side effects of tamoxifen.  REVIEW OF SYSTEMS:   Constitutional: Denies fevers, chills or abnormal weight loss Eyes: Denies blurriness  of vision Ears, nose, mouth, throat, and face: Denies mucositis or sore throat Respiratory: Denies cough, dyspnea or wheezes Cardiovascular: Denies palpitation, chest discomfort Gastrointestinal:  Denies nausea, heartburn or change in bowel habits Skin: Denies abnormal skin rashes Lymphatics: Denies new lymphadenopathy or easy bruising Neurological:Denies numbness, tingling or new weaknesses Behavioral/Psych: Mood is stable, no new changes  Extremities: No lower extremity edema  All other systems were reviewed with the patient and are negative.  I have reviewed the past medical history, past surgical history, social history and family history with the patient and they are unchanged from previous note.  ALLERGIES:  has No Known Allergies.  MEDICATIONS:  Current Outpatient Medications  Medication Sig Dispense Refill  . aspirin 81 MG tablet Take 81 mg by mouth daily.    . Biotin 1000 MCG tablet Take 1,000 mcg by mouth 3 (three) times daily.    . CALCIUM-MAGNESIUM-VITAMIN D PO Take by mouth.    . Coenzyme Q10 (CO Q-10 PO) Take 1 tablet by mouth daily.    . fenofibrate 160 MG tablet Take 160 mg by mouth daily.    . Omega-3 Fatty Acids (FISH OIL OMEGA-3 PO) Take by mouth.     No current facility-administered medications for this visit.     PHYSICAL EXAMINATION: ECOG PERFORMANCE STATUS: 1 - Symptomatic but completely ambulatory  Vitals:   03/18/17 0917  BP: (!) 144/87  Pulse: 90  Resp: 17  Temp: 97.9 F (36.6 C)  SpO2: 98%   Filed Weights   03/18/17 0917  Weight: 112 lb   6.4 oz (51 kg)    GENERAL:alert, no distress and comfortable SKIN: skin color, texture, turgor are normal, no rashes or significant lesions EYES: normal, Conjunctiva are pink and non-injected, sclera clear OROPHARYNX:no exudate, no erythema and lips, buccal mucosa, and tongue normal  NECK: supple, thyroid normal size, non-tender, without nodularity LYMPH:  no palpable lymphadenopathy in the cervical,  axillary or inguinal LUNGS: clear to auscultation and percussion with normal breathing effort HEART: regular rate & rhythm and no murmurs and no lower extremity edema ABDOMEN:abdomen soft, non-tender and normal bowel sounds MUSCULOSKELETAL:no cyanosis of digits and no clubbing  NEURO: alert & oriented x 3 with fluent speech, no focal motor/sensory deficits EXTREMITIES: No lower extremity edema  LABORATORY DATA:  I have reviewed the data as listed CMP Latest Ref Rng & Units 07/29/2016  Glucose 70 - 140 mg/dl 108  BUN 7.0 - 26.0 mg/dL 19.9  Creatinine 0.6 - 1.1 mg/dL 0.8  Sodium 136 - 145 mEq/L 140  Potassium 3.5 - 5.1 mEq/L 4.2  CO2 22 - 29 mEq/L 25  Calcium 8.4 - 10.4 mg/dL 9.8  Total Protein 6.4 - 8.3 g/dL 6.9  Total Bilirubin 0.20 - 1.20 mg/dL 0.30  Alkaline Phos 40 - 150 U/L 56  AST 5 - 34 U/L 29  ALT 0 - 55 U/L 24    Lab Results  Component Value Date   WBC 7.0 07/29/2016   HGB 14.5 07/29/2016   HCT 41.4 07/29/2016   MCV 94.0 07/29/2016   PLT 352 07/29/2016   NEUTROABS 4.5 07/29/2016    ASSESSMENT & PLAN:  Malignant neoplasm of upper-outer quadrant of right breast in female, estrogen receptor positive ([place]) 08/05/2016:Right lumpectomy: IDC with DCIS, 1.2 cm, margins negative, 0/1 lymph node negative, grade 1, ER 100%, PR 95%, HER-2 negative ratio 1.34, Ki-67 3%, T1c N0 stage IA  Oncotype DX score 8: 10-year risk of distant recurrence with tamoxifen alone 6%  Adjuvant radiation therapy 10/26/2016-11/30/2016  Treatment plan: Adjuvant antiestrogen therapy with tamoxifen 20 mg daily times 5 years  bone density showed severe osteopenia T score -2.4. She was recommended to receive Prolia injection.  Especially if she were to take aromatase inhibitor therapy. Patient is contemplating on taking tamoxifen. After much thought she decided to take tamoxifen and she will try 10 mg daily and if she tolerates that she will call us back and we can renew her prescription for the 20  mg daily and give her a 90-day supply.  Tobacco abuse: Cutting down  Return to clinic in 3 months for follow-up I spent 25 minutes talking to the patient of which more than half was spent in counseling and coordination of care.  No orders of the defined types were placed in this encounter.  The patient has a good understanding of the overall plan. she agrees with it. she will call with any problems that may develop before the next visit here.   Harriette Ohara, MD 03/18/17

## 2017-03-18 NOTE — Telephone Encounter (Signed)
Patient declined avs and calendar of upcoming may appointments.

## 2017-06-15 ENCOUNTER — Encounter: Payer: Self-pay | Admitting: Hematology and Oncology

## 2017-06-17 ENCOUNTER — Inpatient Hospital Stay: Payer: Medicare Other | Attending: Hematology and Oncology | Admitting: Hematology and Oncology

## 2017-06-17 ENCOUNTER — Telehealth: Payer: Self-pay | Admitting: Hematology and Oncology

## 2017-06-17 DIAGNOSIS — Z79899 Other long term (current) drug therapy: Secondary | ICD-10-CM | POA: Diagnosis not present

## 2017-06-17 DIAGNOSIS — Z7982 Long term (current) use of aspirin: Secondary | ICD-10-CM | POA: Insufficient documentation

## 2017-06-17 DIAGNOSIS — M8580 Other specified disorders of bone density and structure, unspecified site: Secondary | ICD-10-CM | POA: Insufficient documentation

## 2017-06-17 DIAGNOSIS — Z79810 Long term (current) use of selective estrogen receptor modulators (SERMs): Secondary | ICD-10-CM | POA: Insufficient documentation

## 2017-06-17 DIAGNOSIS — Z923 Personal history of irradiation: Secondary | ICD-10-CM | POA: Diagnosis not present

## 2017-06-17 DIAGNOSIS — C50411 Malignant neoplasm of upper-outer quadrant of right female breast: Secondary | ICD-10-CM | POA: Insufficient documentation

## 2017-06-17 DIAGNOSIS — Z170 Estrogen receptor positive status [ER+]: Secondary | ICD-10-CM | POA: Insufficient documentation

## 2017-06-17 MED ORDER — TAMOXIFEN CITRATE 20 MG PO TABS: 5.0000 mg | ORAL_TABLET | Freq: Every day | ORAL | 0 refills | Status: DC

## 2017-06-17 NOTE — Progress Notes (Signed)
Patient Care Team: Shon Baton, MD as PCP - General (Internal Medicine) Alphonsa Overall, MD as Consulting Physician (General Surgery) Nicholas Lose, MD as Consulting Physician (Hematology and Oncology) Kyung Rudd, MD as Consulting Physician (Radiation Oncology)  DIAGNOSIS:  Encounter Diagnosis  Name Primary?  . Malignant neoplasm of upper-outer quadrant of right breast in female, estrogen receptor positive ([place])     SUMMARY OF ONCOLOGIC HISTORY:   Malignant neoplasm of upper-outer quadrant of right breast in female, estrogen receptor positive ([place])   07/15/2016 Initial Diagnosis    Screening detected right breast asymmetry upper outer quadrant 8 mm at 10:00 position axilla negative; ultrasound biopsy grade 1 invasive ductal carcinoma ER 100%, PR 95%, HER-2 negative ratio 1.34, Ki-67 3%, T1 BN 0 stage I a clinical stage      08/05/2016 Surgery    Right lumpectomy: IDC with DCIS, 1.2 cm, margins negative, 0/1 lymph node negative, grade 1, ER 100%, PR 95%, HER-2 negative ratio 1.34, Ki-67 3%, T1c N0 stage IA      08/27/2016 Oncotype testing    Oncotype DX score 8: 6% RR      10/26/2016 - 11/30/2016 Radiation Therapy    Adjuvant radiation therapy      06/03/2017 -  Anti-estrogen oral therapy    Tamoxifen started at 5 mg daily       CHIEF COMPLIANT: Follow-up on tamoxifen therapy  INTERVAL HISTORY: Jasmine Blanchard is a 66 year old with above-mentioned history of right breast cancer treated with lumpectomy and adjuvant radiation.  She is currently on tamoxifen.  She has profound osteopenia and we did not recommend aromatase inhibitor therapy.  Given that she was anxious about side effects she is taking tamoxifen.  She appears to be tolerating it fairly well.  She started taking 5 mg of tamoxifen daily.  For the past 2 weeks.  She is tolerating it extremely well.  She plans to slowly increase the dosage to 20 mg over time.  She denies any lumps or nodules in the breast.  REVIEW OF  SYSTEMS:   Constitutional: Denies fevers, chills or abnormal weight loss Eyes: Denies blurriness of vision Ears, nose, mouth, throat, and face: Denies mucositis or sore throat Respiratory: Denies cough, dyspnea or wheezes Cardiovascular: Denies palpitation, chest discomfort Gastrointestinal:  Denies nausea, heartburn or change in bowel habits Skin: Denies abnormal skin rashes Lymphatics: Denies new lymphadenopathy or easy bruising Neurological:Denies numbness, tingling or new weaknesses Behavioral/Psych: Mood is stable, no new changes  Extremities: No lower extremity edema Breast:  denies any pain or lumps or nodules in either breasts All other systems were reviewed with the patient and are negative.  I have reviewed the past medical history, past surgical history, social history and family history with the patient and they are unchanged from previous note.  ALLERGIES:  has No Known Allergies.  MEDICATIONS:  Current Outpatient Medications  Medication Sig Dispense Refill  . aspirin 81 MG tablet Take 81 mg by mouth daily.    . Biotin 1000 MCG tablet Take 1,000 mcg by mouth 3 (three) times daily.    Marland Kitchen CALCIUM-MAGNESIUM-VITAMIN D PO Take by mouth.    . Coenzyme Q10 (CO Q-10 PO) Take 1 tablet by mouth daily.    . fenofibrate 160 MG tablet Take 160 mg by mouth daily.    . Omega-3 Fatty Acids (FISH OIL OMEGA-3 PO) Take by mouth.    . tamoxifen (NOLVADEX) 20 MG tablet Take 0.5 tablets (10 mg total) by mouth daily. 30 tablet 0  No current facility-administered medications for this visit.     PHYSICAL EXAMINATION: ECOG PERFORMANCE STATUS: 1 - Symptomatic but completely ambulatory  Vitals:   06/17/17 1018  BP: (!) 138/91  Pulse: 86  Resp: 18  Temp: 98.6 F (37 C)  SpO2: 99%   Filed Weights   06/17/17 1018  Weight: 112 lb 1.6 oz (50.8 kg)    GENERAL:alert, no distress and comfortable SKIN: skin color, texture, turgor are normal, no rashes or significant lesions EYES: normal,  Conjunctiva are pink and non-injected, sclera clear OROPHARYNX:no exudate, no erythema and lips, buccal mucosa, and tongue normal  NECK: supple, thyroid normal size, non-tender, without nodularity LYMPH:  no palpable lymphadenopathy in the cervical, axillary or inguinal LUNGS: clear to auscultation and percussion with normal breathing effort HEART: regular rate & rhythm and no murmurs and no lower extremity edema ABDOMEN:abdomen soft, non-tender and normal bowel sounds MUSCULOSKELETAL:no cyanosis of digits and no clubbing  NEURO: alert & oriented x 3 with fluent speech, no focal motor/sensory deficits EXTREMITIES: No lower extremity edema  LABORATORY DATA:  I have reviewed the data as listed CMP Latest Ref Rng & Units 07/29/2016  Glucose 70 - 140 mg/dl 108  BUN 7.0 - 26.0 mg/dL 19.9  Creatinine 0.6 - 1.1 mg/dL 0.8  Sodium 136 - 145 mEq/L 140  Potassium 3.5 - 5.1 mEq/L 4.2  CO2 22 - 29 mEq/L 25  Calcium 8.4 - 10.4 mg/dL 9.8  Total Protein 6.4 - 8.3 g/dL 6.9  Total Bilirubin 0.20 - 1.20 mg/dL 0.30  Alkaline Phos 40 - 150 U/L 56  AST 5 - 34 U/L 29  ALT 0 - 55 U/L 24    Lab Results  Component Value Date   WBC 7.0 07/29/2016   HGB 14.5 07/29/2016   HCT 41.4 07/29/2016   MCV 94.0 07/29/2016   PLT 352 07/29/2016   NEUTROABS 4.5 07/29/2016    ASSESSMENT & PLAN:  Malignant neoplasm of upper-outer quadrant of right breast in female, estrogen receptor positive ([place]) 08/05/2016:Right lumpectomy: IDC with DCIS, 1.2 cm, margins negative, 0/1 lymph node negative, grade 1, ER 100%, PR 95%, HER-2 negative ratio 1.34, Ki-67 3%, T1c N0 stage IA  Oncotype DX score 8: 10-year risk of distant recurrence with tamoxifen alone 6%  Adjuvant radiation therapy 10/26/2016-11/30/2016  Treatment plan: Adjuvant antiestrogen therapy with tamoxifen 20 mg daily times 5 years started 06/02/2017 Patient started 5 mg with intention to escalate to 20 mg.   Tamoxifen toxicities: Denies any hot  flashes Patient plans to slowly escalate the dosage of tamoxifen  Bone density showed severe osteopenia T score -2.4.  Return to clinic in 6 months for follow-up and after that we can see her once a year   No orders of the defined types were placed in this encounter.  The patient has a good understanding of the overall plan. she agrees with it. she will call with any problems that may develop before the next visit here.   Harriette Ohara, MD 06/17/17

## 2017-06-17 NOTE — Assessment & Plan Note (Signed)
08/05/2016:Right lumpectomy: IDC with DCIS, 1.2 cm, margins negative, 0/1 lymph node negative, grade 1, ER 100%, PR 95%, HER-2 negative ratio 1.34, Ki-67 3%, T1c N0 stage IA  Oncotype DX score 8: 10-year risk of distant recurrence with tamoxifen alone 6%  Adjuvant radiation therapy 10/26/2016-11/30/2016  Treatment plan: Adjuvant antiestrogen therapy with tamoxifen 20 mg daily times 5 years Patient started 10 mg with intention to escalate to 20 mg.   Bone density showed severe osteopenia T score -2.4.

## 2017-06-17 NOTE — Telephone Encounter (Signed)
Gave patient AVs and calendar of upcoming appointments. Patient requested to be scheduled in January

## 2017-07-09 ENCOUNTER — Encounter: Payer: Self-pay | Admitting: Gastroenterology

## 2017-07-11 ENCOUNTER — Encounter: Payer: Self-pay | Admitting: Gastroenterology

## 2017-07-15 ENCOUNTER — Other Ambulatory Visit: Payer: Self-pay | Admitting: Internal Medicine

## 2017-07-15 DIAGNOSIS — Z8249 Family history of ischemic heart disease and other diseases of the circulatory system: Secondary | ICD-10-CM

## 2017-07-15 DIAGNOSIS — E785 Hyperlipidemia, unspecified: Secondary | ICD-10-CM

## 2017-07-29 ENCOUNTER — Ambulatory Visit
Admission: RE | Admit: 2017-07-29 | Discharge: 2017-07-29 | Disposition: A | Discharge status: Home / Self Care | Payer: Medicare Other | Source: Ambulatory Visit | Attending: Internal Medicine | Admitting: Internal Medicine

## 2017-07-29 DIAGNOSIS — E785 Hyperlipidemia, unspecified: Secondary | ICD-10-CM

## 2017-07-29 DIAGNOSIS — Z8249 Family history of ischemic heart disease and other diseases of the circulatory system: Secondary | ICD-10-CM

## 2017-08-20 ENCOUNTER — Encounter: Payer: Self-pay | Admitting: Gastroenterology

## 2017-10-04 ENCOUNTER — Ambulatory Visit: Payer: Self-pay

## 2017-10-04 ENCOUNTER — Encounter: Payer: Self-pay | Admitting: Gastroenterology

## 2017-10-04 ENCOUNTER — Encounter (INDEPENDENT_AMBULATORY_CARE_PROVIDER_SITE_OTHER): Payer: Self-pay

## 2017-10-04 VITALS — Ht 60.0 in | Wt 113.2 lb

## 2017-10-04 DIAGNOSIS — Z86010 Personal history of colonic polyps: Secondary | ICD-10-CM

## 2017-10-04 MED ORDER — NA SULFATE-K SULFATE-MG SULF 17.5-3.13-1.6 GM/177ML PO SOLN: 1.0000 | Freq: Once | ORAL | 0 refills | Status: AC

## 2017-10-04 NOTE — Progress Notes (Signed)
Denies allergies to eggs or soy products. Denies complication of anesthesia or sedation. Denies use of weight loss medication. Denies use of O2.   Emmi instructions declined.  

## 2017-10-07 ENCOUNTER — Ambulatory Visit: Payer: Medicare Other | Admitting: Obstetrics and Gynecology

## 2017-10-18 ENCOUNTER — Ambulatory Visit (AMBULATORY_SURGERY_CENTER): Payer: Medicare Other | Admitting: Gastroenterology

## 2017-10-18 ENCOUNTER — Encounter: Payer: Self-pay | Admitting: Gastroenterology

## 2017-10-18 VITALS — BP 143/68 | HR 73 | Temp 98.6°F | Resp 12 | Ht 65.0 in | Wt 112.0 lb

## 2017-10-18 DIAGNOSIS — D127 Benign neoplasm of rectosigmoid junction: Secondary | ICD-10-CM | POA: Diagnosis not present

## 2017-10-18 DIAGNOSIS — D125 Benign neoplasm of sigmoid colon: Secondary | ICD-10-CM | POA: Diagnosis not present

## 2017-10-18 DIAGNOSIS — D128 Benign neoplasm of rectum: Secondary | ICD-10-CM

## 2017-10-18 DIAGNOSIS — Z86010 Personal history of colonic polyps: Secondary | ICD-10-CM

## 2017-10-18 DIAGNOSIS — D120 Benign neoplasm of cecum: Secondary | ICD-10-CM | POA: Diagnosis not present

## 2017-10-18 MED ORDER — SODIUM CHLORIDE 0.9 % IV SOLN: 500.0000 mL | Freq: Once | INTRAVENOUS | Status: DC

## 2017-10-18 NOTE — Progress Notes (Signed)
Called to room to assist during endoscopic procedure.  Patient ID and intended procedure confirmed with present staff. Received instructions for my participation in the procedure from the performing physician.  

## 2017-10-18 NOTE — Progress Notes (Signed)
Pt states no change in medical history since pre visit. 

## 2017-10-18 NOTE — Patient Instructions (Signed)
**   Handouts given on polyps and diverticulosis **   YOU HAD AN ENDOSCOPIC PROCEDURE TODAY AT THE [facility]:   Refer to the procedure report that was given to you for any specific questions about what was found during the examination.  If the procedure report does not answer your questions, please call your gastroenterologist to clarify.  If you requested that your care partner not be given the details of your procedure findings, then the procedure report has been included in a sealed envelope for you to review at your convenience later.  YOU SHOULD EXPECT: Some feelings of bloating in the abdomen. Passage of more gas than usual.  Walking can help get rid of the air that was put into your GI tract during the procedure and reduce the bloating. If you had a lower endoscopy (such as a colonoscopy or flexible sigmoidoscopy) you may notice spotting of blood in your stool or on the toilet paper. If you underwent a bowel prep for your procedure, you may not have a normal bowel movement for a few days.  Please Note:  You might notice some irritation and congestion in your nose or some drainage.  This is from the oxygen used during your procedure.  There is no need for concern and it should clear up in a day or so.  SYMPTOMS TO REPORT IMMEDIATELY:   Following lower endoscopy (colonoscopy or flexible sigmoidoscopy):  Excessive amounts of blood in the stool  Significant tenderness or worsening of abdominal pains  Swelling of the abdomen that is new, acute  Fever of 100F or higher  For urgent or emergent issues, a gastroenterologist can be reached at any hour by calling [phone].   DIET:  We do recommend a small meal at first, but then you may proceed to your regular diet.  Drink plenty of fluids but you should avoid alcoholic beverages for 24 hours.  ACTIVITY:  You should plan to take it easy for the rest of today and you should NOT DRIVE or use heavy machinery until tomorrow (because  of the sedation medicines used during the test).    FOLLOW UP: Our staff will call the number listed on your records the next business day following your procedure to check on you and address any questions or concerns that you may have regarding the information given to you following your procedure. If we do not reach you, we will leave a message.  However, if you are feeling well and you are not experiencing any problems, there is no need to return our call.  We will assume that you have returned to your regular daily activities without incident.  If any biopsies were taken you will be contacted by phone or by letter within the next 1-3 weeks.  Please call us at [phone] if you have not heard about the biopsies in 3 weeks.    SIGNATURES/CONFIDENTIALITY: You and/or your care partner have signed paperwork which will be entered into your electronic medical record.  These signatures attest to the fact that that the information above on your After Visit Summary has been reviewed and is understood.  Full responsibility of the confidentiality of this discharge information lies with you and/or your care-partner. 

## 2017-10-18 NOTE — Progress Notes (Signed)
To PACU, VSS. Report to [email or web address] 

## 2017-10-18 NOTE — Op Note (Signed)
[facility] Patient Name: Jasmine Blanchard Procedure Date: 10/18/2017 9:11 AM MRN: [id number] Endoscopist: Remo Lipps P. Havery Moros , MD Age: 67 Referring MD:  Date of Birth: 11-20-1950 Gender: Female Account #: [id number] Procedure:                Colonoscopy Indications:              Surveillance: Personal history of adenomatous                            polyps on last colonoscopy 5 years ago Medicines:                Monitored Anesthesia Care Procedure:                Pre-Anesthesia Assessment:                           - Prior to the procedure, a History and Physical                            was performed, and patient medications and                            allergies were reviewed. The patient's tolerance of                            previous anesthesia was also reviewed. The risks                            and benefits of the procedure and the sedation                            options and risks were discussed with the patient.                            All questions were answered, and informed consent                            was obtained. Prior Anticoagulants: The patient has                            taken no previous anticoagulant or antiplatelet                            agents. ASA Grade Assessment: II - A patient with                            mild systemic disease. After reviewing the risks                            and benefits, the patient was deemed in                            satisfactory condition to undergo the procedure.  After obtaining informed consent, the colonoscope                            was passed under direct vision. Throughout the                            procedure, the patient's blood pressure, pulse, and                            oxygen saturations were monitored continuously. The                            Model PCF-[id number]) scope was introduced                            through the anus  and advanced to the the cecum,                            identified by appendiceal orifice and ileocecal                            valve. The colonoscopy was performed without                            difficulty. The patient tolerated the procedure                            well. The quality of the bowel preparation was                            good. The ileocecal valve, appendiceal orifice, and                            rectum were photographed. Scope In: 9:13:23 AM Scope Out: 9:28:44 AM Scope Withdrawal Time: 0 hours 11 minutes 46 seconds  Total Procedure Duration: 0 hours 15 minutes 21 seconds  Findings:                 The perianal and digital rectal examinations were                            normal.                           A 6 mm polyp was found in the cecum. The polyp was                            sessile. The polyp was removed with a cold snare.                            Resection and retrieval were complete.                           A 3 mm polyp was found in the sigmoid colon. The  polyp was sessile. The polyp was removed with a                            cold snare. Resection and retrieval were complete.                           A 4 to 5 mm polyp was found in the rectum. The                            polyp was sessile. The polyp was removed with a                            cold snare. Resection and retrieval were complete.                           Multiple medium-mouthed diverticula were found in                            the entire colon.                           The exam was otherwise without abnormality. Complications:            No immediate complications. Estimated blood loss:                            Minimal. Estimated Blood Loss:     Estimated blood loss was minimal. Impression:               - One 6 mm polyp in the cecum, removed with a cold                            snare. Resected and retrieved.                            - One 3 mm polyp in the sigmoid colon, removed with                            a cold snare. Resected and retrieved.                           - One 4 to 5 mm polyp in the rectum, removed with a                            cold snare. Resected and retrieved.                           - Diverticulosis in the entire examined colon.                           - The examination was otherwise normal. Recommendation:           - Patient has a contact number available for  emergencies. The signs and symptoms of potential                            delayed complications were discussed with the                            patient. Return to normal activities tomorrow.                            Written discharge instructions were provided to the                            patient.                           - Resume previous diet.                           - Continue present medications.                           - Await pathology results.                           - Repeat colonoscopy for surveillance based on                            pathology results. Remo Lipps P. Armbruster, MD 10/18/2017 9:35:23 AM This report has been signed electronically.

## 2017-10-19 ENCOUNTER — Telehealth: Payer: Self-pay

## 2017-10-19 NOTE — Telephone Encounter (Signed)
  Follow up Call-  Call back number 10/18/2017  Post procedure Call Back phone  # [phone]  Permission to leave phone message Yes  Some recent data might be hidden     Patient questions:  Do you have a fever, pain , or abdominal swelling? No. Pain Score  0 *  Have you tolerated food without any problems? Yes.    Have you been able to return to your normal activities? Yes.    Do you have any questions about your discharge instructions: Diet   No. Medications  No. Follow up visit  No.  Do you have questions or concerns about your Care? No.  Actions: * If pain score is 4 or above: No action needed, pain <4.

## 2017-10-26 ENCOUNTER — Encounter: Payer: Self-pay | Admitting: Gastroenterology

## 2018-01-26 NOTE — Progress Notes (Signed)
Patient Care Team: Shon Baton, MD as PCP - General (Internal Medicine) Alphonsa Overall, MD as Consulting Physician (General Surgery) Nicholas Lose, MD as Consulting Physician (Hematology and Oncology) Kyung Rudd, MD as Consulting Physician (Radiation Oncology)  DIAGNOSIS:    ICD-10-CM   1. Malignant neoplasm of upper-outer quadrant of right breast in female, estrogen receptor positive ([place]) C50.411    Z17.0     SUMMARY OF ONCOLOGIC HISTORY:   Malignant neoplasm of upper-outer quadrant of right breast in female, estrogen receptor positive ([place])   07/15/2016 Initial Diagnosis    Screening detected right breast asymmetry upper outer quadrant 8 mm at 10:00 position axilla negative; ultrasound biopsy grade 1 invasive ductal carcinoma ER 100%, PR 95%, HER-2 negative ratio 1.34, Ki-67 3%, T1 BN 0 stage I a clinical stage    08/05/2016 Surgery    Right lumpectomy: IDC with DCIS, 1.2 cm, margins negative, 0/1 lymph node negative, grade 1, ER 100%, PR 95%, HER-2 negative ratio 1.34, Ki-67 3%, T1c N0 stage IA    08/27/2016 Oncotype testing    Oncotype DX score 8: 6% RR    10/26/2016 - 11/30/2016 Radiation Therapy    Adjuvant radiation therapy    06/03/2017 - 09/19/2017 Anti-estrogen oral therapy    Tamoxifen started at 5 mg daily stopped for HF     CHIEF COMPLIANT: Follow-up on Tamoxifen therapy  INTERVAL HISTORY: Jasmine Blanchard is a 68 y.o. with above-mentioned history of right breast cancer treated with lumpectomy and adjuvant radiation. She is currently on tamoxifen. She has profound osteopenia and we did not recommend aromatase inhibitor therapy. She presents to the clinic alone today and stopped taking Tamoxifen in 09/2017 due to hot flashes. She started taking Vitamin K for bone health in addition to Vitamin D and calcium. She reports she will follow-up annually with her PCP and me as needed.   REVIEW OF SYSTEMS:   Constitutional: Denies fevers, chills or abnormal weight loss (+) hot  flashes, resolved Eyes: Denies blurriness of vision Ears, nose, mouth, throat, and face: Denies mucositis or sore throat Respiratory: Denies cough, dyspnea or wheezes Cardiovascular: Denies palpitation, chest discomfort Gastrointestinal:  Denies nausea, heartburn or change in bowel habits Skin: Denies abnormal skin rashes Lymphatics: Denies new lymphadenopathy or easy bruising Neurological:Denies numbness, tingling or new weaknesses Behavioral/Psych: Mood is stable, no new changes  Extremities: No lower extremity edema Breast: denies any pain or lumps or nodules in either breasts All other systems were reviewed with the patient and are negative.  I have reviewed the past medical history, past surgical history, social history and family history with the patient and they are unchanged from previous note.  ALLERGIES:  has No Known Allergies.  MEDICATIONS:  Current Outpatient Medications  Medication Sig Dispense Refill  . aspirin 81 MG tablet Take 81 mg by mouth daily.    . Biotin 1000 MCG tablet Take 1,000 mcg by mouth 3 (three) times daily.    Marland Kitchen CALCIUM-MAGNESIUM-VITAMIN D PO Take by mouth.    . Coenzyme Q10 (CO Q-10 PO) Take 1 tablet by mouth daily.    . fenofibrate 160 MG tablet Take 160 mg by mouth daily.    . Omega-3 Fatty Acids (FISH OIL OMEGA-3 PO) Take by mouth.    Marland Kitchen OVER THE COUNTER MEDICATION Vitamin K 1, 2 with vitamin D. One capsule daily.     No current facility-administered medications for this visit.     PHYSICAL EXAMINATION: ECOG PERFORMANCE STATUS: 1 - Symptomatic but completely ambulatory  Vitals:   01/27/18 1000  BP: 137/90  Pulse: 92  Resp: 18  Temp: 98.6 F (37 C)  SpO2: 97%   Filed Weights   01/27/18 1000  Weight: 110 lb 14.4 oz (50.3 kg)    GENERAL:alert, no distress and comfortable SKIN: skin color, texture, turgor are normal, no rashes or significant lesions EYES: normal, Conjunctiva are pink and non-injected, sclera clear OROPHARYNX:no  exudate, no erythema and lips, buccal mucosa, and tongue normal  NECK: supple, thyroid normal size, non-tender, without nodularity LYMPH:  no palpable lymphadenopathy in the cervical, axillary or inguinal LUNGS: clear to auscultation and percussion with normal breathing effort HEART: regular rate & rhythm and no murmurs and no lower extremity edema ABDOMEN:abdomen soft, non-tender and normal bowel sounds MUSCULOSKELETAL:no cyanosis of digits and no clubbing  NEURO: alert & oriented x 3 with fluent speech, no focal motor/sensory deficits EXTREMITIES: No lower extremity edema  LABORATORY DATA:  I have reviewed the data as listed CMP Latest Ref Rng & Units 07/29/2016  Glucose 70 - 140 mg/dl 108  BUN 7.0 - 26.0 mg/dL 19.9  Creatinine 0.6 - 1.1 mg/dL 0.8  Sodium 136 - 145 mEq/L 140  Potassium 3.5 - 5.1 mEq/L 4.2  CO2 22 - 29 mEq/L 25  Calcium 8.4 - 10.4 mg/dL 9.8  Total Protein 6.4 - 8.3 g/dL 6.9  Total Bilirubin 0.20 - 1.20 mg/dL 0.30  Alkaline Phos 40 - 150 U/L 56  AST 5 - 34 U/L 29  ALT 0 - 55 U/L 24    Lab Results  Component Value Date   WBC 7.0 07/29/2016   HGB 14.5 07/29/2016   HCT 41.4 07/29/2016   MCV 94.0 07/29/2016   PLT 352 07/29/2016   NEUTROABS 4.5 07/29/2016    ASSESSMENT & PLAN:  Malignant neoplasm of upper-outer quadrant of right breast in female, estrogen receptor positive ([place]) 08/05/2016:Right lumpectomy: IDC with DCIS, 1.2 cm, margins negative, 0/1 lymph node negative, grade 1, ER 100%, PR 95%, HER-2 negative ratio 1.34, Ki-67 3%, T1c N0 stage IA  Oncotype DX score 8: 10-year risk of distant recurrence with tamoxifen alone 6%  Adjuvant radiation therapy 10/26/2016-11/30/2016  Treatment plan: Adjuvant antiestrogen therapy withtamoxifen 5 mgdaily started 06/02/2017 stopped September 2019 because of hot flashes.  She does not want to take any further antiestrogen therapy.  She understands the risks and benefits of her decision. I offered her anastrozole  1 mg daily in combination with a bisphosphonate  therapy.  She will study this and will call us if she wants to consider it but otherwise she does not plan to go on any further antiestrogen's.  Bone density showed severe osteopenia T score -2.4.  Patient would like to follow-up with her primary care physician for her annual breast exams.  She will call us and be seen on an as-needed basis.   No orders of the defined types were placed in this encounter.  The patient has a good understanding of the overall plan. she agrees with it. she will call with any problems that may develop before the next visit here.  Nicholas Lose, MD 01/27/2018   I, Molly Dorshimer, am acting as scribe for Nicholas Lose, MD.  I have reviewed the above documentation for accuracy and completeness, and I agree with the above.

## 2018-01-27 ENCOUNTER — Inpatient Hospital Stay: Payer: Medicare Other | Attending: Hematology and Oncology | Admitting: Hematology and Oncology

## 2018-01-27 DIAGNOSIS — Z170 Estrogen receptor positive status [ER+]: Secondary | ICD-10-CM | POA: Insufficient documentation

## 2018-01-27 DIAGNOSIS — Z923 Personal history of irradiation: Secondary | ICD-10-CM | POA: Diagnosis not present

## 2018-01-27 DIAGNOSIS — Z79899 Other long term (current) drug therapy: Secondary | ICD-10-CM | POA: Diagnosis not present

## 2018-01-27 DIAGNOSIS — Z9223 Personal history of estrogen therapy: Secondary | ICD-10-CM

## 2018-01-27 DIAGNOSIS — C50411 Malignant neoplasm of upper-outer quadrant of right female breast: Secondary | ICD-10-CM | POA: Diagnosis not present

## 2018-01-27 DIAGNOSIS — M8580 Other specified disorders of bone density and structure, unspecified site: Secondary | ICD-10-CM

## 2018-01-27 DIAGNOSIS — Z7982 Long term (current) use of aspirin: Secondary | ICD-10-CM | POA: Insufficient documentation

## 2018-01-27 NOTE — Assessment & Plan Note (Signed)
08/05/2016:Right lumpectomy: IDC with DCIS, 1.2 cm, margins negative, 0/1 lymph node negative, grade 1, ER 100%, PR 95%, HER-2 negative ratio 1.34, Ki-67 3%, T1c N0 stage IA  Oncotype DX score 8: 10-year risk of distant recurrence with tamoxifen alone 6%  Adjuvant radiation therapy 10/26/2016-11/30/2016  Treatment plan: Adjuvant antiestrogen therapy withtamoxifen 5 mgdaily started 06/02/2017 stopped September 2019 because of hot flashes.  She does not want to take any further antiestrogen therapy.  She understands the risks and benefits of her decision. I offered her anastrozole 1 mg daily in combination with a bisphosphonate  therapy.  She will study this and will call us if she wants to consider it but otherwise she does not plan to go on any further antiestrogen's.  Bone density showed severe osteopenia T score -2.4.  Patient would like to follow-up with her primary care physician for her annual breast exams.  She will call us and be seen on an as-needed basis.

## 2018-01-28 ENCOUNTER — Telehealth: Payer: Self-pay | Admitting: Hematology and Oncology

## 2018-01-28 NOTE — Telephone Encounter (Signed)
Per 1/9 no los °

## 2018-05-31 ENCOUNTER — Encounter: Payer: Self-pay | Admitting: *Deleted

## 2018-05-31 NOTE — Progress Notes (Signed)
Received Mammogram results from [place].  Sent to be scanned into epic.

## 2018-06-03 ENCOUNTER — Telehealth: Payer: Self-pay | Admitting: Hematology and Oncology

## 2018-06-03 NOTE — Telephone Encounter (Signed)
Spoke with patient January 2021 f/u.

## 2019-01-29 NOTE — Progress Notes (Signed)
Patient Care Team: Shon Baton, MD as PCP - General (Internal Medicine) Alphonsa Overall, MD as Consulting Physician (General Surgery) Nicholas Lose, MD as Consulting Physician (Hematology and Oncology) Kyung Rudd, MD as Consulting Physician (Radiation Oncology)  DIAGNOSIS:    ICD-10-CM   1. Malignant neoplasm of upper-outer quadrant of right breast in female, estrogen receptor positive ([place])  C50.411    Z17.0     SUMMARY OF ONCOLOGIC HISTORY: Oncology History  Malignant neoplasm of upper-outer quadrant of right breast in female, estrogen receptor positive ([place])  07/15/2016 Initial Diagnosis   Screening detected right breast asymmetry upper outer quadrant 8 mm at 10:00 position axilla negative; ultrasound biopsy grade 1 invasive ductal carcinoma ER 100%, PR 95%, HER-2 negative ratio 1.34, Ki-67 3%, T1 BN 0 stage I a clinical stage   08/05/2016 Surgery   Right lumpectomy: IDC with DCIS, 1.2 cm, margins negative, 0/1 lymph node negative, grade 1, ER 100%, PR 95%, HER-2 negative ratio 1.34, Ki-67 3%, T1c N0 stage IA   08/27/2016 Oncotype testing   Oncotype DX score 8: 6% RR   10/26/2016 - 11/30/2016 Radiation Therapy   Adjuvant radiation therapy   06/03/2017 - 09/19/2017 Anti-estrogen oral therapy   Tamoxifen started at 5 mg daily stopped for HF     CHIEF COMPLIANT: Follow-up on right breast cancer  INTERVAL HISTORY: Jasmine Blanchard is a 69 y.o. with above-mentioned history of right breast cancer treated with lumpectomy, radiation, and who is currently on tamoxifen. Mammogram on 05/23/18 showed no evidence of malignancy bilaterally. She presents to the clinic today for annual follow-up.  She continues to have discomfort in the right breast especially in the axilla as well as some tenderness around the implant.  She would like to see a Psychiatric nurse but wants to wait until the Covid pandemic is under better control.   ALLERGIES:  has No Known Allergies.  MEDICATIONS:  Current  Outpatient Medications  Medication Sig Dispense Refill  . aspirin 81 MG tablet Take 81 mg by mouth daily.    . Biotin 1000 MCG tablet Take 1,000 mcg by mouth 3 (three) times daily.    Marland Kitchen CALCIUM-MAGNESIUM-VITAMIN D PO Take by mouth.    . Coenzyme Q10 (CO Q-10 PO) Take 1 tablet by mouth daily.    . fenofibrate 160 MG tablet Take 160 mg by mouth daily.    . Omega-3 Fatty Acids (FISH OIL OMEGA-3 PO) Take by mouth.    Marland Kitchen OVER THE COUNTER MEDICATION Vitamin K 1, 2 with vitamin D. One capsule daily.     No current facility-administered medications for this visit.    PHYSICAL EXAMINATION: ECOG PERFORMANCE STATUS: 1 - Symptomatic but completely ambulatory  Vitals:   01/30/19 1041  BP: 129/90  Pulse: 94  Resp: 18  Temp: 98.3 F (36.8 C)  SpO2: 98%   Filed Weights   01/30/19 1041  Weight: 111 lb 4.8 oz (50.5 kg)    BREAST: Bilateral implants no palpable lumps or nodules of concern (exam performed in the presence of a chaperone)  LABORATORY DATA:  I have reviewed the data as listed CMP Latest Ref Rng & Units 07/29/2016  Glucose 70 - 140 mg/dl 108  BUN 7.0 - 26.0 mg/dL 19.9  Creatinine 0.6 - 1.1 mg/dL 0.8  Sodium 136 - 145 mEq/L 140  Potassium 3.5 - 5.1 mEq/L 4.2  CO2 22 - 29 mEq/L 25  Calcium 8.4 - 10.4 mg/dL 9.8  Total Protein 6.4 - 8.3 g/dL 6.9  Total Bilirubin  0.20 - 1.20 mg/dL 0.30  Alkaline Phos 40 - 150 U/L 56  AST 5 - 34 U/L 29  ALT 0 - 55 U/L 24    Lab Results  Component Value Date   WBC 7.0 07/29/2016   HGB 14.5 07/29/2016   HCT 41.4 07/29/2016   MCV 94.0 07/29/2016   PLT 352 07/29/2016   NEUTROABS 4.5 07/29/2016    ASSESSMENT & PLAN:  Malignant neoplasm of upper-outer quadrant of right breast in female, estrogen receptor positive ([place]) 08/05/2016:Right lumpectomy: IDC with DCIS, 1.2 cm, margins negative, 0/1 lymph node negative, grade 1, ER 100%, PR 95%, HER-2 negative ratio 1.34, Ki-67 3%, T1c N0 stage IA  Oncotype DX score 8: 10-year risk of distant  recurrence with tamoxifen alone 6%  Adjuvant radiation therapy 10/26/2016-11/30/2016  Treatment plan: Adjuvant antiestrogen therapy withtamoxifen 5 mgdailystarted 06/02/2017 stopped September 2019 because of hot flashes.  Patient refused to take any further antiestrogen therapy after carefully considering risks and benefits.  Osteopenia: T score -2.4: Patient could benefit from close monitoring as well as calcium and vitamin D and weightbearing exercises versus taking bisphosphonate therapy.  Breast cancer surveillance: 1.  Breast exam on 12/09/2019: Benign 2.  Mamm[facility] 06/02/2018: Benign breast density category B  She hopes to travel  to [place] and [place] for vacation. Previously we discussed that she was going to follow with the primary care physician for her annual visits.  We are happy to see her once a year if she desires.    No orders of the defined types were placed in this encounter.  The patient has a good understanding of the overall plan. she agrees with it. she will call with any problems that may develop before the next visit here.  Total time spent: 15 mins including face to face time and time spent for planning, charting and coordination of care  Nicholas Lose, MD 01/30/2019  I, Cloyde Reams Dorshimer, am acting as scribe for Dr. Nicholas Lose.  I have reviewed the above documentation for accuracy and completeness, and I agree with the above.

## 2019-01-30 ENCOUNTER — Telehealth: Payer: Self-pay | Admitting: Hematology and Oncology

## 2019-01-30 ENCOUNTER — Other Ambulatory Visit: Payer: Self-pay

## 2019-01-30 ENCOUNTER — Inpatient Hospital Stay: Payer: Medicare Other | Attending: Hematology and Oncology | Admitting: Hematology and Oncology

## 2019-01-30 DIAGNOSIS — Z923 Personal history of irradiation: Secondary | ICD-10-CM | POA: Diagnosis not present

## 2019-01-30 DIAGNOSIS — M8580 Other specified disorders of bone density and structure, unspecified site: Secondary | ICD-10-CM | POA: Diagnosis not present

## 2019-01-30 DIAGNOSIS — Z79899 Other long term (current) drug therapy: Secondary | ICD-10-CM | POA: Diagnosis not present

## 2019-01-30 DIAGNOSIS — Z170 Estrogen receptor positive status [ER+]: Secondary | ICD-10-CM | POA: Diagnosis not present

## 2019-01-30 DIAGNOSIS — Z7982 Long term (current) use of aspirin: Secondary | ICD-10-CM | POA: Insufficient documentation

## 2019-01-30 DIAGNOSIS — C50411 Malignant neoplasm of upper-outer quadrant of right female breast: Secondary | ICD-10-CM

## 2019-01-30 DIAGNOSIS — Z853 Personal history of malignant neoplasm of breast: Secondary | ICD-10-CM | POA: Insufficient documentation

## 2019-01-30 DIAGNOSIS — Z9223 Personal history of estrogen therapy: Secondary | ICD-10-CM | POA: Insufficient documentation

## 2019-01-30 NOTE — Telephone Encounter (Signed)
I talk with patient regarding schedule  

## 2019-01-30 NOTE — Assessment & Plan Note (Signed)
08/05/2016:Right lumpectomy: IDC with DCIS, 1.2 cm, margins negative, 0/1 lymph node negative, grade 1, ER 100%, PR 95%, HER-2 negative ratio 1.34, Ki-67 3%, T1c N0 stage IA  Oncotype DX score 8: 10-year risk of distant recurrence with tamoxifen alone 6%  Adjuvant radiation therapy 10/26/2016-11/30/2016  Treatment plan: Adjuvant antiestrogen therapy withtamoxifen 5 mgdailystarted 06/02/2017 stopped September 2019 because of hot flashes.  Patient refused to take any further antiestrogen therapy after carefully considering risks and benefits.  Osteopenia: T score -2.4: Patient could benefit from close monitoring as well as calcium and vitamin D and weightbearing exercises versus taking bisphosphonate therapy.  Breast cancer surveillance: 1.  Breast exam on 12/09/2019: Benign 2.  Mammogram at [facility] 06/02/2018: Benign breast density category B  Previously we discussed that she was going to follow with the primary care physician for her annual visits.  We are happy to see her once a year if she desires.

## 2019-02-14 ENCOUNTER — Ambulatory Visit: Payer: Medicare Other

## 2019-02-18 ENCOUNTER — Ambulatory Visit: Payer: Medicare Other

## 2019-02-23 ENCOUNTER — Ambulatory Visit: Payer: Medicare Other | Attending: Internal Medicine

## 2019-02-23 ENCOUNTER — Ambulatory Visit: Payer: Medicare Other

## 2019-02-23 DIAGNOSIS — Z23 Encounter for immunization: Secondary | ICD-10-CM

## 2019-02-23 NOTE — Progress Notes (Signed)
   Covid-19 Vaccination Clinic  Name:  Jasmine Blanchard    MRN: [id number] DOB: September 22, 1950  02/23/2019  Jasmine Blanchard was observed post Covid-19 immunization for 15 minutes without incidence. She was provided with Vaccine Information Sheet and instruction to access the V-Safe system.   Jasmine Blanchard was instructed to call 911 with any severe reactions post vaccine: Marland Kitchen Difficulty breathing  . Swelling of your face and throat  . A fast heartbeat  . A bad rash all over your body  . Dizziness and weakness    Immunizations Administered    Name Date Dose VIS Date Route   Pfizer COVID-19 Vaccine 02/23/2019  4:26 PM 0.3 mL 12/30/2018 Intramuscular   Manufacturer: [place]   Lot: [id number]   [place]: [id number]

## 2019-03-03 ENCOUNTER — Ambulatory Visit: Payer: Medicare Other

## 2019-03-21 ENCOUNTER — Ambulatory Visit: Payer: Medicare Other | Attending: Internal Medicine

## 2019-03-21 DIAGNOSIS — Z23 Encounter for immunization: Secondary | ICD-10-CM

## 2019-03-21 NOTE — Progress Notes (Signed)
   Covid-19 Vaccination Clinic  Name:  Jasmine Blanchard    MRN: [id number] DOB: 1950/10/02  03/21/2019  Jasmine Blanchard was observed post Covid-19 immunization for 15 minutes without incident. She was provided with Vaccine Information Sheet and instruction to access the V-Safe system.   Jasmine Blanchard was instructed to call 911 with any severe reactions post vaccine: Marland Kitchen Difficulty breathing  . Swelling of face and throat  . A fast heartbeat  . A bad rash all over body  . Dizziness and weakness   Immunizations Administered    Name Date Dose VIS Date Route   Pfizer COVID-19 Vaccine 03/21/2019  8:41 AM 0.3 mL 12/30/2018 Intramuscular   Manufacturer: [place]   Lot: [id number]   [place]: [id number]

## 2019-06-01 ENCOUNTER — Encounter: Payer: Self-pay | Admitting: Hematology and Oncology

## 2019-06-19 IMAGING — CT CT HEART SCORING
3 series · 13 of 20 positions shown, 15 images · non-contrast
Comparison: None.

CLINICAL DATA: 67-year-old Caucasian female with family history of
coronary artery disease. Other risk factors for coronary disease
include hyperlipidemia and smoking. Personal history of RIGHT breast
cancer, UPPER OUTER QUADRANT, post lumpectomy in July 2016.

EXAM:
CT HEART FOR CALCIUM SCORING
TECHNIQUE: CT heart was performed on a 64 channel system using prospective ECG
gating.
A non-contrast exam for calcium scoring was performed.
Note that this exam targets the heart and the chest was not imaged
in its entirety.

[Series 2: calcium scoring 2.00 qr36 bestdiast 71% · axial · 0.28mm/px · z∈[+1580,+1644]mm · 3 of 80 slices shown]
[im 16/80  vessel]
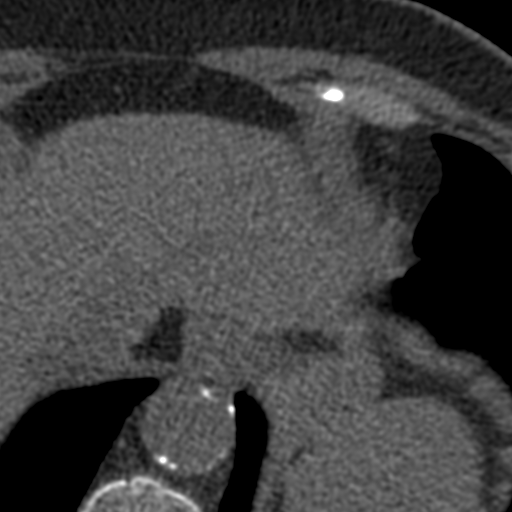
[im 32/80  vessel]
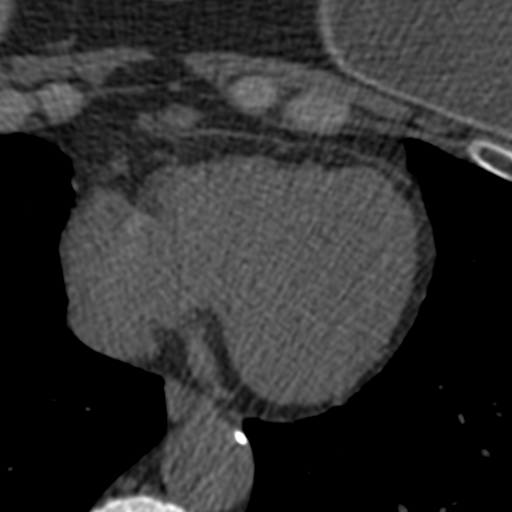
[im 48/80  vessel]
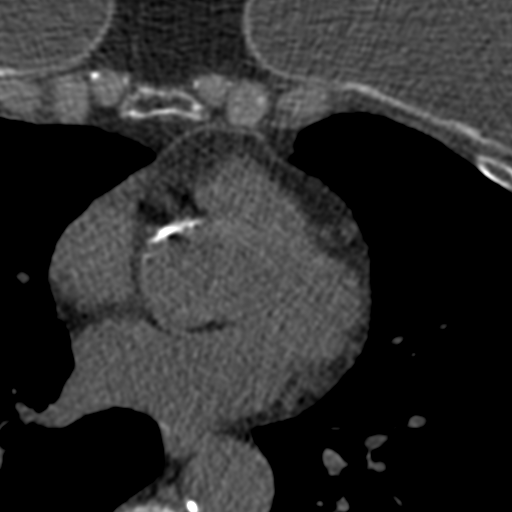

[Series 3: calcium scoring 2.00 br40 bestdiast 71% ax fov · axial · 0.41mm/px · z∈[+1576,+1680]mm · 5 of 80 slices shown, 7 images]
[im 14/80  vessel]
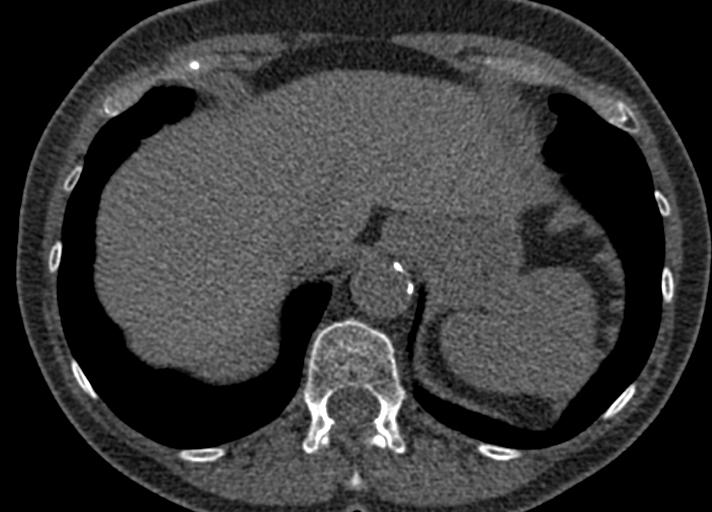
[im 14/80  lung]
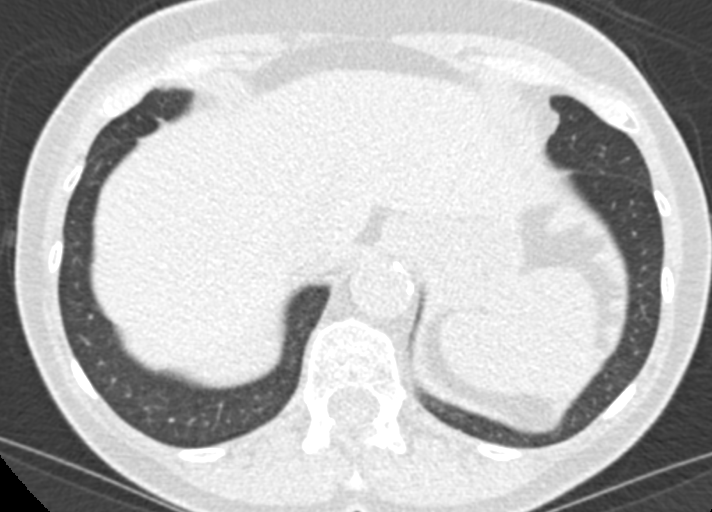
[im 27/80  vessel]
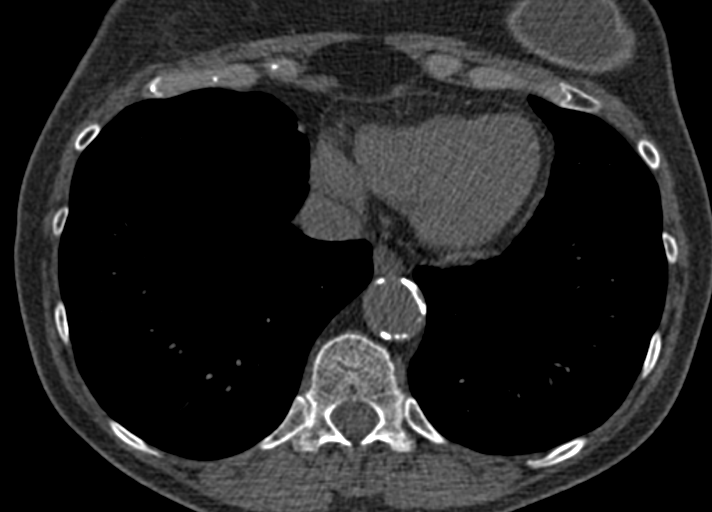
[im 40/80  vessel]
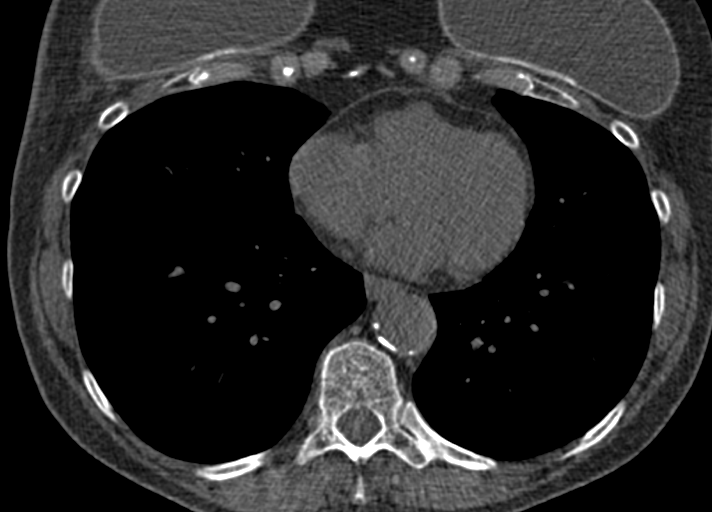
[im 53/80  vessel]
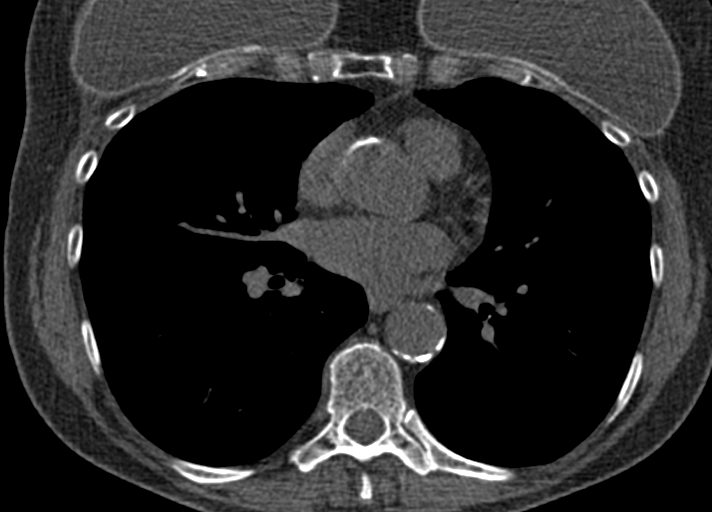
[im 66/80  vessel]
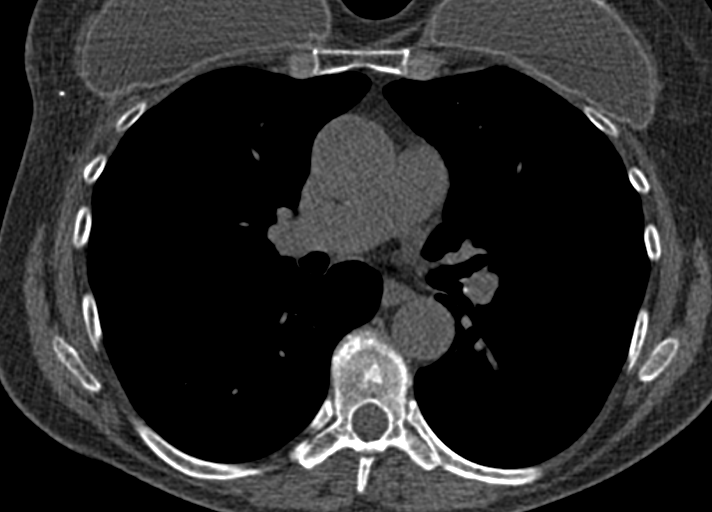
[im 66/80  lung]
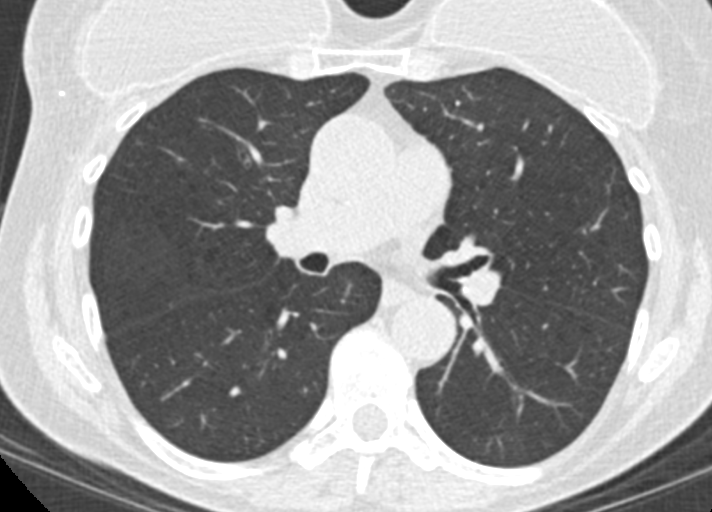

[Series 9: calcium scoring 2.00 br60 bestdiast 71% ax fov · axial · 0.41mm/px · z∈[+1576,+1680]mm · 5 of 80 slices shown]
[im 14/80  vessel]
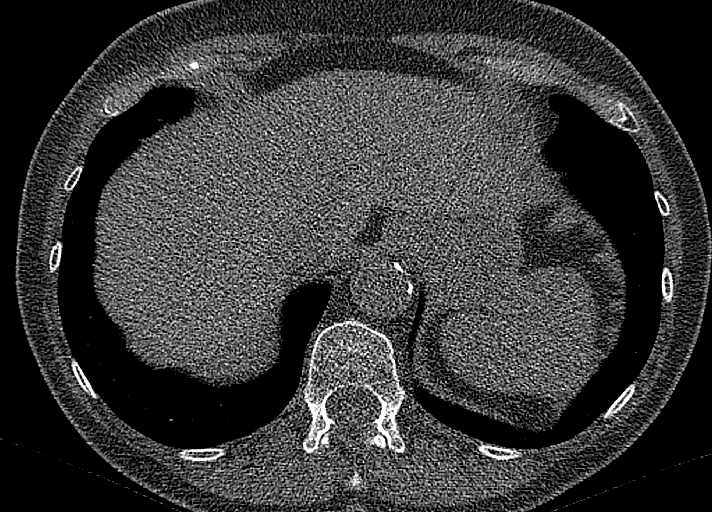
[im 27/80  vessel]
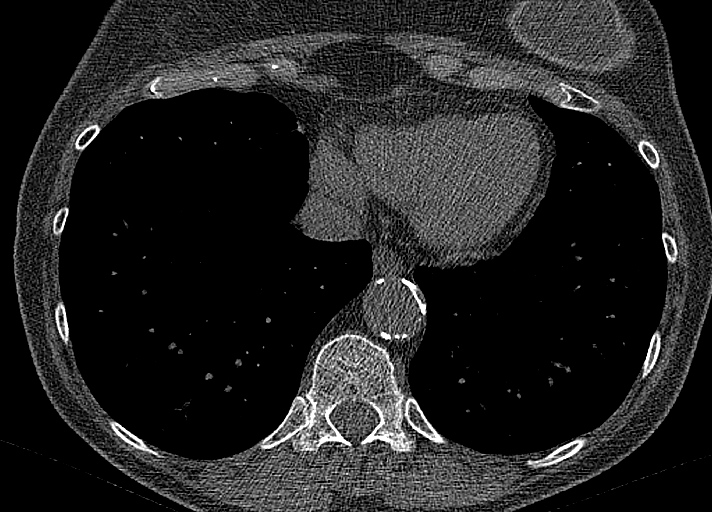
[im 40/80  vessel]
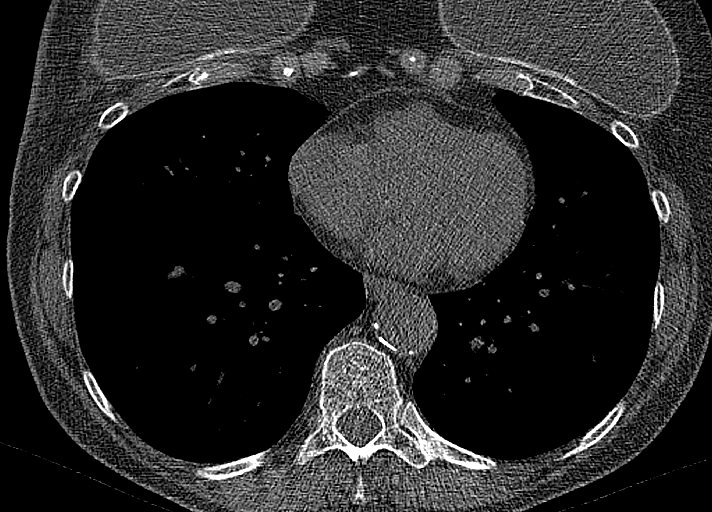
[im 53/80  vessel]
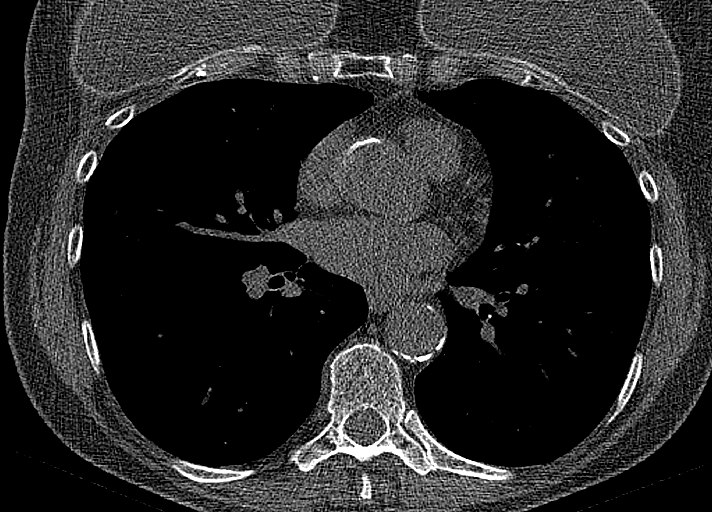
[im 66/80  vessel]
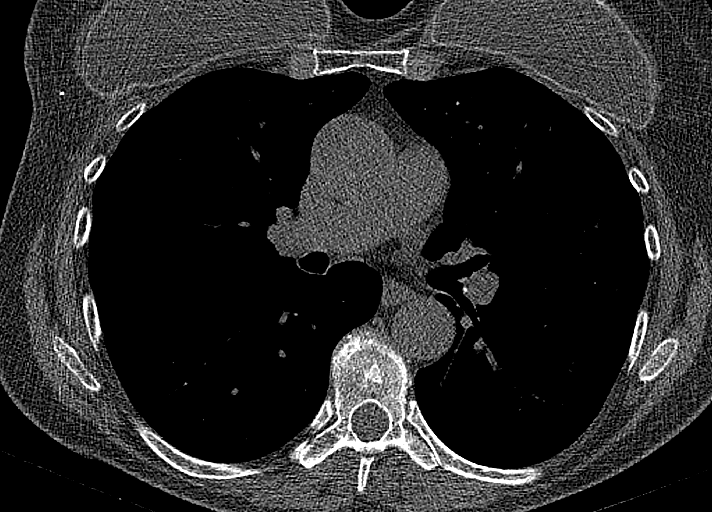

[13 of 20 positions shown; findings below may reference images not displayed]

FINDINGS: Technical quality: Good.

CORONARY CALCIUM

Total Agatston Score: 344

[HOSPITAL] percentile:  90th

OTHER FINDINGS:

Cardiovascular: Moderate to severe atherosclerosis involving the
visualized thoracic and proximal abdominal aorta without evidence of
aneurysm.

Mediastinum/Nodes: No pathologic lymphadenopathy within the
visualized mediastinum. Visualized esophagus normal in appearance.

Lungs/Pleura: Emphysematous changes within the visualized lung
parenchyma. Visualized lung parenchyma clear. Central bronchi patent
without significant bronchial wall thickening. No pleural effusions.

Upper Abdomen: Visualized upper abdomen normal for the unenhanced
technique.

Musculoskeletal: Osseous demineralization. Mild degenerative disc
disease and spondylosis involving midthoracic spine. No acute
findings.
IMPRESSION: 1. Total coronary calcium Agatston score of 344 which places the
patient in the 90th percentile for age and race according to the
[HOSPITAL].
2. Aortic Atherosclerosis (QT1FW-J0G.G) and Emphysema (QT1FW-GM0.4).

## 2019-07-21 LAB — IFOBT (OCCULT BLOOD): IFOBT: NEGATIVE

## 2019-09-14 ENCOUNTER — Other Ambulatory Visit: Payer: Self-pay

## 2019-09-14 ENCOUNTER — Ambulatory Visit: Payer: Medicare Other | Admitting: Cardiology

## 2019-09-14 VITALS — BP 140/80 | HR 88 | Ht 65.0 in | Wt 112.0 lb

## 2019-09-14 DIAGNOSIS — R0602 Shortness of breath: Secondary | ICD-10-CM | POA: Diagnosis not present

## 2019-09-14 DIAGNOSIS — Z01812 Encounter for preprocedural laboratory examination: Secondary | ICD-10-CM | POA: Diagnosis not present

## 2019-09-14 DIAGNOSIS — I209 Angina pectoris, unspecified: Secondary | ICD-10-CM | POA: Diagnosis not present

## 2019-09-14 LAB — CBC
Hematocrit: 39.4 % (ref 34.0–46.6)
Hemoglobin: 14 g/dL (ref 11.1–15.9)
MCH: 31.6 pg (ref 26.6–33.0)
MCHC: 35.5 g/dL (ref 31.5–35.7)
MCV: 89 fL (ref 79–97)
Platelets: 349 10*3/uL (ref 150–450)
RBC: 4.43 x10E6/uL (ref 3.77–5.28)
RDW: 11.8 % (ref 11.7–15.4)
WBC: 6.2 10*3/uL (ref 3.4–10.8)

## 2019-09-14 LAB — BASIC METABOLIC PANEL
BUN/Creatinine Ratio: 24 (ref 12–28)
BUN: 17 mg/dL (ref 8–27)
CO2: 25 mmol/L (ref 20–29)
Calcium: 10.6 mg/dL — ABNORMAL HIGH (ref 8.7–10.3)
Chloride: 103 mmol/L (ref 96–106)
Creatinine, Ser: 0.71 mg/dL (ref 0.57–1.00)
GFR calc Af Amer: 100 mL/min/{1.73_m2} (ref >59)
GFR calc non Af Amer: 87 mL/min/{1.73_m2} (ref >59)
Glucose: 85 mg/dL (ref 65–99)
Potassium: 4.3 mmol/L (ref 3.5–5.2)
Sodium: 140 mmol/L (ref 134–144)

## 2019-09-14 NOTE — Progress Notes (Signed)
Cardiology Office Note:    Date:  09/14/2019   ID:  Jasmine Blanchard, DOB [birth year], MRN [id number]  PCP:  Shon Baton, MD  [facility] Cardiologist:  No primary care provider on file.  [facility] Electrophysiologist:  None   Referring MD: Shon Baton, MD     History of Present Illness:    Jasmine Blanchard is a 69 y.o. female here for the evaluation of coronary atherosclerosis secondary to coronary calcification at the request of Dr. Virgina Jock.  She had a total coronary calcium score 344, 90th percentile.  She was changed from fibrate over to Crestor.  10 mg of Crestor.  Had myalgias at one point went back to fibrate.  Also having some dyspnea on exertion which has been attributed to her emphysema.  Past Medical History:  Diagnosis Date  . Aortic atherosclerosis ([place])   . Blood pressure elevated without history of HTN   . Breast cancer ([place]) 07/16/2016   right ER/PR positive, grade 1 invasive ductal carcinoma  . Cigarette smoker   . Coronary atherosclerosis due to calcified coronary lesion   . Dental crowns present    also caps and dental implants  . Depression   . Emphysema lung ([place])   . Family history of early CAD   . Hyperlipidemia   . Immature cataract    Patient denies  . Onychomycosis   . Osteopenia   . Osteoporosis   . PONV (postoperative nausea and vomiting)   . Skin mole   . Vitamin D deficiency     Past Surgical History:  Procedure Laterality Date  . AUGMENTATION MAMMAPLASTY    . BREAST ENHANCEMENT SURGERY  1987; 1995  . BREAST LUMPECTOMY WITH RADIOACTIVE SEED AND SENTINEL LYMPH NODE BIOPSY Right 08/05/2016   Procedure: RIGHT BREAST LUMPECTOMY WITH RADIOACTIVE SEED AND RIGHT AXILLARY  SENTINEL LYMPH NODE BIOPSY;  Surgeon: Alphonsa Overall, MD;  Location: [facility];  Service: General;  Laterality: Right;  . COLONOSCOPY WITH PROPOFOL  08/05/2012    Current Medications: Current Meds  Medication Sig  . aspirin 81 MG tablet Take 81 mg by  mouth daily.  . Biotin 1000 MCG tablet Take 1,000 mcg by mouth 3 (three) times daily.  Marland Kitchen CALCIUM-MAGNESIUM-VITAMIN D PO Take by mouth.  . cholecalciferol (VITAMIN D3) 25 MCG (1000 UNIT) tablet Take 1,000 Units by mouth daily.  . Coenzyme Q10 (CO Q-10 PO) Take 1 tablet by mouth daily.  . fenofibrate 160 MG tablet Take 160 mg by mouth daily.  . Menaquinone-7 (VITAMIN K2 PO) Take by mouth.  . Omega-3 Fatty Acids (FISH OIL OMEGA-3 PO) Take by mouth.  Marland Kitchen OVER THE COUNTER MEDICATION Vitamin K 1, 2 with vitamin D. One capsule daily.  . Vitamin D-Vitamin K (VITAMIN K2-VITAMIN D3 PO) Take by mouth.     Allergies:   Patient has no known allergies.   Social History   Socioeconomic History  . Marital status: Single    Spouse name: Not on file  . Number of children: Not on file  . Years of education: Not on file  . Highest education level: Not on file  Occupational History  . Not on file  Tobacco Use  . Smoking status: Current Every Day Smoker    Packs/day: 0.50    Years: 40.00    Pack years: 20.00    Types: Cigarettes  . Smokeless tobacco: Never Used  Vaping Use  . Vaping Use: Never used  Substance and Sexual Activity  . Alcohol use:  Yes    Alcohol/week: 10.0 standard drinks    Types: 10 Glasses of wine per week  . Drug use: No  . Sexual activity: Not Currently    Partners: Male    Birth control/protection: Post-menopausal  Other Topics Concern  . Not on file  Social History Narrative  . Not on file   Social Determinants of Health   Financial Resource Strain:   . Difficulty of Paying Living Expenses: Not on file  Food Insecurity:   . Worried About Charity fundraiser in the Last Year: Not on file  . Ran Out of Food in the Last Year: Not on file  Transportation Needs:   . Lack of Transportation (Medical): Not on file  . Lack of Transportation (Non-Medical): Not on file  Physical Activity:   . Days of Exercise per Week: Not on file  . Minutes of Exercise per Session: Not on  file  Stress:   . Feeling of Stress : Not on file  Social Connections:   . Frequency of Communication with Friends and Family: Not on file  . Frequency of Social Gatherings with Friends and Family: Not on file  . Attends Religious Services: Not on file  . Active Member of Clubs or Organizations: Not on file  . Attends Archivist Meetings: Not on file  . Marital Status: Not on file     Family History: The patient's family history includes Breast cancer in her mother; Heart attack in her father; Heart disease in her mother. There is no history of Colon cancer, Esophageal cancer, Rectal cancer, or Stomach cancer.  ROS:   Please see the history of present illness.    No fevers chills nausea vomiting syncope bleeding all other systems reviewed and are negative.  EKGs/Labs/Other Studies Reviewed:    The following studies were reviewed today: CT scan of chest, calcium score reviewed personally with her.  Went over images from 07/29/2017.  Calcium score in the 300s.  90th percentile.  Looks like she has proximal RCA and left main calcification.  She also has diffuse aortic atherosclerosis.  EKG:  EKG is  ordered today.  The ekg ordered today demonstrates sinus rhythm 88 right atrial enlargement  Recent Labs: No results found for requested labs within last 8760 hours.  Recent Lipid Panel No results found for: CHOL, TRIG, HDL, CHOLHDL, VLDL, LDLCALC, LDLDIRECT  Physical Exam:    VS:  BP 140/80   Pulse 88   Ht 5\' 5"  (1.651 m)   Wt 112 lb (50.8 kg)   LMP 12/19/2000 (Approximate)   SpO2 97%   BMI 18.64 kg/m     Wt Readings from Last 3 Encounters:  09/14/19 112 lb (50.8 kg)  01/30/19 111 lb 4.8 oz (50.5 kg)  01/27/18 110 lb 14.4 oz (50.3 kg)     GEN: Thin, well developed in no acute distress HEENT: Normal NECK: No JVD; No carotid bruits LYMPHATICS: No lymphadenopathy CARDIAC: RRR, no murmurs, rubs, gallops RESPIRATORY:  Clear to auscultation without rales, wheezing or  rhonchi  ABDOMEN: Soft, non-tender, non-distended MUSCULOSKELETAL:  No edema; No deformity  SKIN: Warm and dry NEUROLOGIC:  Alert and oriented x 3 PSYCHIATRIC:  Normal affect   ASSESSMENT:    1. SOB (shortness of breath)   2. Angina pectoris ([place])   3. Pre-procedure lab exam    PLAN:    In order of problems listed above:  Shortness of breath/shoulder discomfort with activity/coronary artery disease/coronary artery calcification/long-term smoker/abnormal EKG -Given her  symptoms which are escalating, with worsening shortness of breath with activity which may be indicative of an unstable anginal symptom with associated shoulder tightness, I would like for her to go ahead and proceed with right and left heart catheterization.  Risks and benefits of been explained including stroke heart attack death renal impairment bleeding.  She is willing to proceed.  She is concerned.  Coronary atherosclerosis was visualized on CT scan, I showed her this.  Given the proximal nature of the calcification, I am worried that if she ended up having multivessel coronary disease that are pharmacologic stress test would be inaccurate.  I would like to get a definitive answer.  So would she. -We will check an echocardiogram to evaluate structure and function.  Right atrial enlargement noted on ECG. Cath has been scheduled on 09/22/2019-Dr. Martinique  Statin intolerance -She is currently taking supplement.  She had trouble with Crestor in the past.  Myalgias.  Ultimately, I would like for her once her heart catheterization is done to discuss with the lipid clinic for further options.  LDL goal less than 70. -Last LDL 161, HDL 65, hemoglobin 15.7, creatinine 0.8 outside office.  Emphysematous changes on CT -Tobacco cessation discussed.  She is down to 5 cigarettes a day which is a marked improvement.  Certainly this could be contributing to her shortness of breath.  History of breast cancer -Had radiation therapy.  3  years ago.  Breast augmentation noted.  Outside notes reviewed, EKG, calcium score personally reviewed, discussion of heart catheterization.   Medication Adjustments/Labs and Tests Ordered: Current medicines are reviewed at length with the patient today.  Concerns regarding medicines are outlined above.  Orders Placed This Encounter  Procedures  . CBC  . Basic metabolic panel  . ECHOCARDIOGRAM COMPLETE   No orders of the defined types were placed in this encounter.   Patient Instructions  Medication Instructions:  The current medical regimen is effective;  continue present plan and medications.  *If you need a refill on your cardiac medications before your next appointment, please call your pharmacy*  Lab Work: Please have blood work (CBC, BMP) If you have labs (blood work) drawn today and your tests are completely normal, you will receive your results only by: Marland Kitchen MyChart Message (if you have MyChart) OR . A paper copy in the mail If you have any lab test that is abnormal or we need to change your treatment, we will call you to review the results.  Due to recent COVID-19 restrictions implemented by our local and state authorities and in an effort to keep both patients and staff as safe as possible, our hospital system requires COVID-19 testing prior to certain scheduled hospital procedures.  Please go to [place]. [place], [place] [address] on September 20, 2019 at  11:30 am   .  This is a drive up testing site.  You will not need to exit your vehicle.  You will not be billed at the time of testing but may receive a bill later depending on your insurance.  The approximate cost of the test is $100.  You must agree to self-quarantine from the time of your testing until the procedure date on  Friday 9/3.  This should included staying home with ONLY the people you live with.  Avoid take-out, grocery store shopping or leaving the house for any non-emergent reason.  Failure to have your  COVID-19 test done on the date and time you have been scheduled will  result in cancellation of your procedure.  Please call our office at [phone] if you have any questions.  Testing/Procedures: Your physician has requested that you have an echocardiogram. Echocardiography is a painless test that uses sound waves to create images of your heart. It provides your doctor with information about the size and shape of your heart and how well your heart's chambers and valves are working. This procedure takes approximately one hour. There are no restrictions for this procedure.     [facility] OFFICE [place], [place] [address] Dept: [phone] Loc: [phone]  GHADA ABBETT  09/14/2019  You are scheduled for a Cardiac Cath on Friday September 22, 2019 with Dr. Peter Martinique.  1. Please arrive at the [facility] (Main Entrance A) at [facility]: [address] [place], [place] [address] at 5:30 am (two hours before your procedure to ensure your preparation). Free valet parking service is available.   Special note: Every effort is made to have your procedure done on time. Please understand that emergencies sometimes delay scheduled procedures.  2. Diet: Nothing after midnight.  3. Labs: As scheduled here at our office.  Covid screen as listed/instructed above.  4. Medication instructions in preparation for your procedure:  On the morning of your procedure, take your Aspirin and any morning medicines NOT listed above.  You may use sips of water.  5. Plan for one night stay--bring personal belongings. 6. Bring a current list of your medications and current insurance cards. 7. You MUST have a responsible person to drive you home. 8. Someone MUST be with you the first 24 hours after you arrive home or your discharge will be delayed. 9. Please wear clothes that are easy to get on and off  and wear slip-on shoes.  Thank you for allowing [place] to care for you!   -- [facility]  Follow-Up: At [facility], you and your health needs are our priority.  As part of our continuing mission to provide you with exceptional heart care, we have created designated Provider Care Teams.  These Care Teams include your primary Cardiologist (physician) and Advanced Practice Providers (APPs -  Physician Assistants and Nurse Practitioners) who all work together to provide you with the care you need, when you need it.  We recommend signing up for the patient portal called "MyChart".  Sign up information is provided on this After Visit Summary.  MyChart is used to connect with patients for Virtual Visits (Telemedicine).  Patients are able to view lab/test results, encounter notes, upcoming appointments, etc.  Non-urgent messages can be sent to your provider as well.   To learn more about what you can do with MyChart, go to [email or web address].    Your next appointment:   4 week(s) after your heart cath  The format for your next appointment:   In Person  Provider:   Candee Furbish, MD   Thank you for choosing [facility]!!         Signed, Candee Furbish, MD  09/14/2019 12:09 PM    [place]

## 2019-09-14 NOTE — H&P (View-Only) (Signed)
Cardiology Office Note:    Date:  09/14/2019   ID:  Jasmine Blanchard, DOB 05/06/1950, MRN [id number]  PCP:  Shon Baton, MD  [facility] Cardiologist:  No primary care provider on file.  [facility] Electrophysiologist:  None   Referring MD: Shon Baton, MD     History of Present Illness:    Jasmine Blanchard is a 69 y.o. female here for the evaluation of coronary atherosclerosis secondary to coronary calcification at the request of Dr. Virgina Jock.  She had a total coronary calcium score 344, 90th percentile.  She was changed from fibrate over to Crestor.  10 mg of Crestor.  Had myalgias at one point went back to fibrate.  Also having some dyspnea on exertion which has been attributed to her emphysema.  Past Medical History:  Diagnosis Date  . Aortic atherosclerosis ([place])   . Blood pressure elevated without history of HTN   . Breast cancer ([place]) 07/16/2016   right ER/PR positive, grade 1 invasive ductal carcinoma  . Cigarette smoker   . Coronary atherosclerosis due to calcified coronary lesion   . Dental crowns present    also caps and dental implants  . Depression   . Emphysema lung ([place])   . Family history of early CAD   . Hyperlipidemia   . Immature cataract    Patient denies  . Onychomycosis   . Osteopenia   . Osteoporosis   . PONV (postoperative nausea and vomiting)   . Skin mole   . Vitamin D deficiency     Past Surgical History:  Procedure Laterality Date  . AUGMENTATION MAMMAPLASTY    . BREAST ENHANCEMENT SURGERY  1987; 1995  . BREAST LUMPECTOMY WITH RADIOACTIVE SEED AND SENTINEL LYMPH NODE BIOPSY Right 08/05/2016   Procedure: RIGHT BREAST LUMPECTOMY WITH RADIOACTIVE SEED AND RIGHT AXILLARY  SENTINEL LYMPH NODE BIOPSY;  Surgeon: Alphonsa Overall, MD;  Location: [facility];  Service: General;  Laterality: Right;  . COLONOSCOPY WITH PROPOFOL  08/05/2012    Current Medications: Current Meds  Medication Sig  . aspirin 81 MG tablet Take 81 mg by  mouth daily.  . Biotin 1000 MCG tablet Take 1,000 mcg by mouth 3 (three) times daily.  Marland Kitchen CALCIUM-MAGNESIUM-VITAMIN D PO Take by mouth.  . cholecalciferol (VITAMIN D3) 25 MCG (1000 UNIT) tablet Take 1,000 Units by mouth daily.  . Coenzyme Q10 (CO Q-10 PO) Take 1 tablet by mouth daily.  . fenofibrate 160 MG tablet Take 160 mg by mouth daily.  . Menaquinone-7 (VITAMIN K2 PO) Take by mouth.  . Omega-3 Fatty Acids (FISH OIL OMEGA-3 PO) Take by mouth.  Marland Kitchen OVER THE COUNTER MEDICATION Vitamin K 1, 2 with vitamin D. One capsule daily.  . Vitamin D-Vitamin K (VITAMIN K2-VITAMIN D3 PO) Take by mouth.     Allergies:   Patient has no known allergies.   Social History   Socioeconomic History  . Marital status: Single    Spouse name: Not on file  . Number of children: Not on file  . Years of education: Not on file  . Highest education level: Not on file  Occupational History  . Not on file  Tobacco Use  . Smoking status: Current Every Day Smoker    Packs/day: 0.50    Years: 40.00    Pack years: 20.00    Types: Cigarettes  . Smokeless tobacco: Never Used  Vaping Use  . Vaping Use: Never used  Substance and Sexual Activity  . Alcohol use:  Yes    Alcohol/week: 10.0 standard drinks    Types: 10 Glasses of wine per week  . Drug use: No  . Sexual activity: Not Currently    Partners: Male    Birth control/protection: Post-menopausal  Other Topics Concern  . Not on file  Social History Narrative  . Not on file   Social Determinants of Health   Financial Resource Strain:   . Difficulty of Paying Living Expenses: Not on file  Food Insecurity:   . Worried About Charity fundraiser in the Last Year: Not on file  . Ran Out of Food in the Last Year: Not on file  Transportation Needs:   . Lack of Transportation (Medical): Not on file  . Lack of Transportation (Non-Medical): Not on file  Physical Activity:   . Days of Exercise per Week: Not on file  . Minutes of Exercise per Session: Not on  file  Stress:   . Feeling of Stress : Not on file  Social Connections:   . Frequency of Communication with Friends and Family: Not on file  . Frequency of Social Gatherings with Friends and Family: Not on file  . Attends Religious Services: Not on file  . Active Member of Clubs or Organizations: Not on file  . Attends Archivist Meetings: Not on file  . Marital Status: Not on file     Family History: The patient's family history includes Breast cancer in her mother; Heart attack in her father; Heart disease in her mother. There is no history of Colon cancer, Esophageal cancer, Rectal cancer, or Stomach cancer.  ROS:   Please see the history of present illness.    No fevers chills nausea vomiting syncope bleeding all other systems reviewed and are negative.  EKGs/Labs/Other Studies Reviewed:    The following studies were reviewed today: CT scan of chest, calcium score reviewed personally with her.  Went over images from 07/29/2017.  Calcium score in the 300s.  90th percentile.  Looks like she has proximal RCA and left main calcification.  She also has diffuse aortic atherosclerosis.  EKG:  EKG is  ordered today.  The ekg ordered today demonstrates sinus rhythm 88 right atrial enlargement  Recent Labs: No results found for requested labs within last 8760 hours.  Recent Lipid Panel No results found for: CHOL, TRIG, HDL, CHOLHDL, VLDL, LDLCALC, LDLDIRECT  Physical Exam:    VS:  BP 140/80   Pulse 88   Ht 5\' 5"  (1.651 m)   Wt 112 lb (50.8 kg)   LMP 12/19/2000 (Approximate)   SpO2 97%   BMI 18.64 kg/m     Wt Readings from Last 3 Encounters:  09/14/19 112 lb (50.8 kg)  01/30/19 111 lb 4.8 oz (50.5 kg)  01/27/18 110 lb 14.4 oz (50.3 kg)     GEN: Thin, well developed in no acute distress HEENT: Normal NECK: No JVD; No carotid bruits LYMPHATICS: No lymphadenopathy CARDIAC: RRR, no murmurs, rubs, gallops RESPIRATORY:  Clear to auscultation without rales, wheezing or  rhonchi  ABDOMEN: Soft, non-tender, non-distended MUSCULOSKELETAL:  No edema; No deformity  SKIN: Warm and dry NEUROLOGIC:  Alert and oriented x 3 PSYCHIATRIC:  Normal affect   ASSESSMENT:    1. SOB (shortness of breath)   2. Angina pectoris ([place])   3. Pre-procedure lab exam    PLAN:    In order of problems listed above:  Shortness of breath/shoulder discomfort with activity/coronary artery disease/coronary artery calcification/long-term smoker/abnormal EKG -Given her  symptoms which are escalating, with worsening shortness of breath with activity which may be indicative of an unstable anginal symptom with associated shoulder tightness, I would like for her to go ahead and proceed with right and left heart catheterization.  Risks and benefits of been explained including stroke heart attack death renal impairment bleeding.  She is willing to proceed.  She is concerned.  Coronary atherosclerosis was visualized on CT scan, I showed her this.  Given the proximal nature of the calcification, I am worried that if she ended up having multivessel coronary disease that are pharmacologic stress test would be inaccurate.  I would like to get a definitive answer.  So would she. -We will check an echocardiogram to evaluate structure and function.  Right atrial enlargement noted on ECG. Cath has been scheduled on 09/22/2019-Dr. Martinique  Statin intolerance -She is currently taking supplement.  She had trouble with Crestor in the past.  Myalgias.  Ultimately, I would like for her once her heart catheterization is done to discuss with the lipid clinic for further options.  LDL goal less than 70. -Last LDL 161, HDL 65, hemoglobin 15.7, creatinine 0.8 outside office.  Emphysematous changes on CT -Tobacco cessation discussed.  She is down to 5 cigarettes a day which is a marked improvement.  Certainly this could be contributing to her shortness of breath.  History of breast cancer -Had radiation therapy.  3  years ago.  Breast augmentation noted.  Outside notes reviewed, EKG, calcium score personally reviewed, discussion of heart catheterization.   Medication Adjustments/Labs and Tests Ordered: Current medicines are reviewed at length with the patient today.  Concerns regarding medicines are outlined above.  Orders Placed This Encounter  Procedures  . CBC  . Basic metabolic panel  . ECHOCARDIOGRAM COMPLETE   No orders of the defined types were placed in this encounter.   Patient Instructions  Medication Instructions:  The current medical regimen is effective;  continue present plan and medications.  *If you need a refill on your cardiac medications before your next appointment, please call your pharmacy*  Lab Work: Please have blood work (CBC, BMP) If you have labs (blood work) drawn today and your tests are completely normal, you will receive your results only by: Marland Kitchen MyChart Message (if you have MyChart) OR . A paper copy in the mail If you have any lab test that is abnormal or we need to change your treatment, we will call you to review the results.  Due to recent COVID-19 restrictions implemented by our local and state authorities and in an effort to keep both patients and staff as safe as possible, our hospital system requires COVID-19 testing prior to certain scheduled hospital procedures.  Please go to [address]. [place], [place] [address] on September 20, 2019 at  11:30 am   .  This is a drive up testing site.  You will not need to exit your vehicle.  You will not be billed at the time of testing but may receive a bill later depending on your insurance.  The approximate cost of the test is $100.  You must agree to self-quarantine from the time of your testing until the procedure date on  Friday 9/3.  This should included staying home with ONLY the people you live with.  Avoid take-out, grocery store shopping or leaving the house for any non-emergent reason.  Failure to have your  COVID-19 test done on the date and time you have been scheduled will  result in cancellation of your procedure.  Please call our office at [phone] if you have any questions.  Testing/Procedures: Your physician has requested that you have an echocardiogram. Echocardiography is a painless test that uses sound waves to create images of your heart. It provides your doctor with information about the size and shape of your heart and how well your heart's chambers and valves are working. This procedure takes approximately one hour. There are no restrictions for this procedure.    [place] OFFICE [place], [place] [address] Dept: [phone] Loc: [phone]  Jasmine Blanchard  09/14/2019  You are scheduled for a Cardiac Cath on Friday September 22, 2019 with Dr. Peter Martinique.  1. Please arrive at the [facility] (Main Entrance A) at [facility]: [address] [place], [place] [address] at 5:30 am (two hours before your procedure to ensure your preparation). Free valet parking service is available.   Special note: Every effort is made to have your procedure done on time. Please understand that emergencies sometimes delay scheduled procedures.  2. Diet: Nothing after midnight.  3. Labs: As scheduled here at our office.  Covid screen as listed/instructed above.  4. Medication instructions in preparation for your procedure:  On the morning of your procedure, take your Aspirin and any morning medicines NOT listed above.  You may use sips of water.  5. Plan for one night stay--bring personal belongings. 6. Bring a current list of your medications and current insurance cards. 7. You MUST have a responsible person to drive you home. 8. Someone MUST be with you the first 24 hours after you arrive home or your discharge will be delayed. 9. Please wear clothes that are easy to get on and off  and wear slip-on shoes.  Thank you for allowing [place] to care for you!   -- [place] [facility]  Follow-Up: At [facility], you and your health needs are our priority.  As part of our continuing mission to provide you with exceptional heart care, we have created designated Provider Care Teams.  These Care Teams include your primary Cardiologist (physician) and Advanced Practice Providers (APPs -  Physician Assistants and Nurse Practitioners) who all work together to provide you with the care you need, when you need it.  We recommend signing up for the patient portal called "MyChart".  Sign up information is provided on this After Visit Summary.  MyChart is used to connect with patients for Virtual Visits (Telemedicine).  Patients are able to view lab/test results, encounter notes, upcoming appointments, etc.  Non-urgent messages can be sent to your provider as well.   To learn more about what you can do with MyChart, go to [email or web address].    Your next appointment:   4 week(s) after your heart cath  The format for your next appointment:   In Person  Provider:   Candee Furbish, MD   Thank you for choosing [facility]!!         Signed, Candee Furbish, MD  09/14/2019 12:09 PM    [place]

## 2019-09-14 NOTE — Patient Instructions (Addendum)
Medication Instructions:  The current medical regimen is effective;  continue present plan and medications.  *If you need a refill on your cardiac medications before your next appointment, please call your pharmacy*  Lab Work: Please have blood work (CBC, BMP) If you have labs (blood work) drawn today and your tests are completely normal, you will receive your results only by: Marland Kitchen MyChart Message (if you have MyChart) OR . A paper copy in the mail If you have any lab test that is abnormal or we need to change your treatment, we will call you to review the results.  Due to recent COVID-19 restrictions implemented by our local and state authorities and in an effort to keep both patients and staff as safe as possible, our hospital system requires COVID-19 testing prior to certain scheduled hospital procedures.  Please go to [address]. [place], [place] [address] on September 20, 2019 at  11:30 am   .  This is a drive up testing site.  You will not need to exit your vehicle.  You will not be billed at the time of testing but may receive a bill later depending on your insurance.  The approximate cost of the test is $100.  You must agree to self-quarantine from the time of your testing until the procedure date on  Friday 9/3.  This should included staying home with ONLY the people you live with.  Avoid take-out, grocery store shopping or leaving the house for any non-emergent reason.  Failure to have your COVID-19 test done on the date and time you have been scheduled will result in cancellation of your procedure.  Please call our office at [phone] if you have any questions.  Testing/Procedures: Your physician has requested that you have an echocardiogram. Echocardiography is a painless test that uses sound waves to create images of your heart. It provides your doctor with information about the size and shape of your heart and how well your heart's chambers and valves are working. This procedure  takes approximately one hour. There are no restrictions for this procedure.    [place] OFFICE [place], [place] [address] Dept: [phone] Loc: [phone]  Jasmine Blanchard  09/14/2019  You are scheduled for a Cardiac Cath on Friday September 22, 2019 with Dr. Peter Martinique.  1. Please arrive at the [facility] (Main Entrance A) at [facility]: [address] [place], [place] [address] at 5:30 am (two hours before your procedure to ensure your preparation). Free valet parking service is available.   Special note: Every effort is made to have your procedure done on time. Please understand that emergencies sometimes delay scheduled procedures.  2. Diet: Nothing after midnight.  3. Labs: As scheduled here at our office.  Covid screen as listed/instructed above.  4. Medication instructions in preparation for your procedure:  On the morning of your procedure, take your Aspirin and any morning medicines NOT listed above.  You may use sips of water.  5. Plan for one night stay--bring personal belongings. 6. Bring a current list of your medications and current insurance cards. 7. You MUST have a responsible person to drive you home. 8. Someone MUST be with you the first 24 hours after you arrive home or your discharge will be delayed. 9. Please wear clothes that are easy to get on and off and wear slip-on shoes.  Thank you for allowing [place] to  care for you!   -- [place] [facility] services  Follow-Up: At [facility], you and your health needs are our priority.  As part of our continuing mission to provide you with exceptional heart care, we have created designated Provider Care Teams.  These Care Teams include your primary Cardiologist (physician) and Advanced Practice Providers (APPs -  Physician Assistants and Nurse Practitioners) who all work together to  provide you with the care you need, when you need it.  We recommend signing up for the patient portal called "MyChart".  Sign up information is provided on this After Visit Summary.  MyChart is used to connect with patients for Virtual Visits (Telemedicine).  Patients are able to view lab/test results, encounter notes, upcoming appointments, etc.  Non-urgent messages can be sent to your provider as well.   To learn more about what you can do with MyChart, go to [email or web address].    Your next appointment:   4 week(s) after your heart cath  The format for your next appointment:   In Person  Provider:   Candee Furbish, MD   Thank you for choosing [facility]!!

## 2019-09-20 ENCOUNTER — Other Ambulatory Visit (HOSPITAL_COMMUNITY)
Admission: RE | Admit: 2019-09-20 | Discharge: 2019-09-20 | Disposition: A | Discharge status: Home / Self Care | Payer: Medicare Other | Source: Ambulatory Visit | Attending: Cardiology | Admitting: Cardiology

## 2019-09-20 DIAGNOSIS — Z20822 Contact with and (suspected) exposure to covid-19: Secondary | ICD-10-CM | POA: Insufficient documentation

## 2019-09-20 DIAGNOSIS — Z01812 Encounter for preprocedural laboratory examination: Secondary | ICD-10-CM | POA: Insufficient documentation

## 2019-09-20 LAB — SARS CORONAVIRUS 2 (TAT 6-24 HRS): SARS Coronavirus 2: NEGATIVE

## 2019-09-21 ENCOUNTER — Telehealth: Payer: Self-pay | Admitting: Cardiology

## 2019-09-21 ENCOUNTER — Telehealth: Payer: Self-pay | Admitting: *Deleted

## 2019-09-21 NOTE — Telephone Encounter (Signed)
Spoke pt and reviewed pre-cath instructions as documented in previous call by Desiree Lucy, RN.  Pt acknowledges instructions, has reviewed lab results and has no further questions.

## 2019-09-21 NOTE — Telephone Encounter (Signed)
Spoke pt and reviewed pre-cath instructions as documented in this note.  Pt acknowledges instructions, has reviewed lab results and has no further questions.

## 2019-09-21 NOTE — Telephone Encounter (Signed)
Pt contacted pre-catheterization scheduled at [facility] for: Friday September 22, 2019 7:30 AM Verified arrival time and place: [facility]) at: 5:30 AM   No solid food after midnight prior to cath, clear liquids until 5 AM day of procedure.   AM meds can be  taken pre-cath with sips of water including: ASA 81 mg   Confirmed patient has responsible adult to drive home post procedure and be with patient first 24 hours after arriving home:  You are allowed ONE visitor in the waiting room during the time you are at the hospital for your procedure. Both you and your visitor must wear a mask once you enter the hospital.       COVID-19 Pre-Screening Questions:   In the past 10 days have you had a new cough, shortness of breath, headache, congestion, fever (100 or greater) unexplained body aches, new sore throat, or sudden loss of taste or sense of smell?  In the past 10 days have you been around anyone with known Covid 19?   Have you been vaccinated for COVID-19?    LMTCB to review procedure instructions

## 2019-09-21 NOTE — Telephone Encounter (Signed)
Patient is returning phone call. Please call back 

## 2019-09-22 ENCOUNTER — Encounter (HOSPITAL_COMMUNITY): Payer: Self-pay | Admitting: Cardiology

## 2019-09-22 ENCOUNTER — Ambulatory Visit (HOSPITAL_COMMUNITY)
Admission: RE | Admit: 2019-09-22 | Discharge: 2019-09-22 | Disposition: A | Discharge status: Home / Self Care | Payer: Medicare Other | Attending: Cardiology | Admitting: Cardiology

## 2019-09-22 ENCOUNTER — Other Ambulatory Visit: Payer: Self-pay

## 2019-09-22 ENCOUNTER — Encounter (HOSPITAL_COMMUNITY)
Admission: RE | Disposition: A | Discharge status: Home / Self Care | Payer: Self-pay | Source: Home / Self Care | Attending: Cardiology

## 2019-09-22 DIAGNOSIS — Z803 Family history of malignant neoplasm of breast: Secondary | ICD-10-CM | POA: Insufficient documentation

## 2019-09-22 DIAGNOSIS — F17210 Nicotine dependence, cigarettes, uncomplicated: Secondary | ICD-10-CM | POA: Insufficient documentation

## 2019-09-22 DIAGNOSIS — I209 Angina pectoris, unspecified: Secondary | ICD-10-CM

## 2019-09-22 DIAGNOSIS — Z7982 Long term (current) use of aspirin: Secondary | ICD-10-CM | POA: Insufficient documentation

## 2019-09-22 DIAGNOSIS — Z853 Personal history of malignant neoplasm of breast: Secondary | ICD-10-CM | POA: Insufficient documentation

## 2019-09-22 DIAGNOSIS — Z8249 Family history of ischemic heart disease and other diseases of the circulatory system: Secondary | ICD-10-CM | POA: Insufficient documentation

## 2019-09-22 DIAGNOSIS — E7800 Pure hypercholesterolemia, unspecified: Secondary | ICD-10-CM | POA: Diagnosis present

## 2019-09-22 DIAGNOSIS — I25110 Atherosclerotic heart disease of native coronary artery with unstable angina pectoris: Secondary | ICD-10-CM | POA: Insufficient documentation

## 2019-09-22 DIAGNOSIS — R0602 Shortness of breath: Secondary | ICD-10-CM | POA: Diagnosis present

## 2019-09-22 DIAGNOSIS — Z79899 Other long term (current) drug therapy: Secondary | ICD-10-CM | POA: Insufficient documentation

## 2019-09-22 DIAGNOSIS — M810 Age-related osteoporosis without current pathological fracture: Secondary | ICD-10-CM | POA: Diagnosis not present

## 2019-09-22 DIAGNOSIS — I2510 Atherosclerotic heart disease of native coronary artery without angina pectoris: Secondary | ICD-10-CM | POA: Diagnosis present

## 2019-09-22 DIAGNOSIS — E785 Hyperlipidemia, unspecified: Secondary | ICD-10-CM | POA: Diagnosis not present

## 2019-09-22 DIAGNOSIS — M8580 Other specified disorders of bone density and structure, unspecified site: Secondary | ICD-10-CM | POA: Diagnosis not present

## 2019-09-22 DIAGNOSIS — R0600 Dyspnea, unspecified: Secondary | ICD-10-CM | POA: Diagnosis not present

## 2019-09-22 DIAGNOSIS — R0609 Other forms of dyspnea: Secondary | ICD-10-CM | POA: Diagnosis present

## 2019-09-22 DIAGNOSIS — R030 Elevated blood-pressure reading, without diagnosis of hypertension: Secondary | ICD-10-CM | POA: Diagnosis not present

## 2019-09-22 DIAGNOSIS — Z720 Tobacco use: Secondary | ICD-10-CM | POA: Diagnosis present

## 2019-09-22 DIAGNOSIS — Z01812 Encounter for preprocedural laboratory examination: Secondary | ICD-10-CM

## 2019-09-22 HISTORY — PX: RIGHT/LEFT HEART CATH AND CORONARY ANGIOGRAPHY: CATH118266

## 2019-09-22 LAB — POCT I-STAT 7, (LYTES, BLD GAS, ICA,H+H)
Acid-Base Excess: 1 mmol/L (ref 0.0–2.0)
Bicarbonate: 28.5 mmol/L — ABNORMAL HIGH (ref 20.0–28.0)
Calcium, Ion: 1.26 mmol/L (ref 1.15–1.40)
HCT: 40 % (ref 36.0–46.0)
Hemoglobin: 13.6 g/dL (ref 12.0–15.0)
O2 Saturation: 100 %
Potassium: 3.7 mmol/L (ref 3.5–5.1)
Sodium: 142 mmol/L (ref 135–145)
TCO2: 30 mmol/L (ref 22–32)
pCO2 arterial: 56.7 mmHg — ABNORMAL HIGH (ref 32.0–48.0)
pH, Arterial: 7.31 — ABNORMAL LOW (ref 7.350–7.450)
pO2, Arterial: 244 mmHg — ABNORMAL HIGH (ref 83.0–108.0)

## 2019-09-22 LAB — POCT I-STAT EG7
Acid-Base Excess: 1 mmol/L (ref 0.0–2.0)
Bicarbonate: 29 mmol/L — ABNORMAL HIGH (ref 20.0–28.0)
Calcium, Ion: 1.3 mmol/L (ref 1.15–1.40)
HCT: 40 % (ref 36.0–46.0)
Hemoglobin: 13.6 g/dL (ref 12.0–15.0)
O2 Saturation: 79 %
Potassium: 3.7 mmol/L (ref 3.5–5.1)
Sodium: 142 mmol/L (ref 135–145)
TCO2: 31 mmol/L (ref 22–32)
pCO2, Ven: 62.3 mmHg — ABNORMAL HIGH (ref 44.0–60.0)
pH, Ven: 7.276 (ref 7.250–7.430)
pO2, Ven: 51 mmHg — ABNORMAL HIGH (ref 32.0–45.0)

## 2019-09-22 SURGERY — RIGHT/LEFT HEART CATH AND CORONARY ANGIOGRAPHY
Anesthesia: LOCAL

## 2019-09-22 MED ORDER — ASPIRIN 81 MG PO CHEW: 81.0000 mg | CHEWABLE_TABLET | ORAL | Status: DC

## 2019-09-22 MED ORDER — MIDAZOLAM HCL 2 MG/2ML IJ SOLN
INTRAMUSCULAR | Status: AC
  Filled 2019-09-22: qty 2

## 2019-09-22 MED ORDER — HEPARIN (PORCINE) IN NACL 1000-0.9 UT/500ML-% IV SOLN
INTRAVENOUS | Status: DC | PRN
  Administered 2019-09-22 (×2): 500 mL

## 2019-09-22 MED ORDER — LIDOCAINE HCL (PF) 1 % IJ SOLN
INTRAMUSCULAR | Status: AC
  Filled 2019-09-22: qty 30

## 2019-09-22 MED ORDER — HEPARIN SODIUM (PORCINE) 1000 UNIT/ML IJ SOLN
INTRAMUSCULAR | Status: DC | PRN
  Administered 2019-09-22: 2500 [IU] via INTRAVENOUS

## 2019-09-22 MED ORDER — LIDOCAINE HCL (PF) 1 % IJ SOLN
INTRAMUSCULAR | Status: DC | PRN
  Administered 2019-09-22 (×2): 2 mL via INTRADERMAL

## 2019-09-22 MED ORDER — SODIUM CHLORIDE 0.9 % IV SOLN: 250.0000 mL | INTRAVENOUS | Status: DC | PRN

## 2019-09-22 MED ORDER — VERAPAMIL HCL 2.5 MG/ML IV SOLN
INTRAVENOUS | Status: DC | PRN
  Administered 2019-09-22: 10 mL via INTRA_ARTERIAL

## 2019-09-22 MED ORDER — FENTANYL CITRATE (PF) 100 MCG/2ML IJ SOLN
INTRAMUSCULAR | Status: AC
  Filled 2019-09-22: qty 2

## 2019-09-22 MED ORDER — SODIUM CHLORIDE 0.9 % WEIGHT BASED INFUSION: 1.0000 mL/kg/h | INTRAVENOUS | Status: DC

## 2019-09-22 MED ORDER — IOHEXOL 350 MG/ML SOLN
INTRAVENOUS | Status: DC | PRN
  Administered 2019-09-22: 50 mL via INTRA_ARTERIAL

## 2019-09-22 MED ORDER — MIDAZOLAM HCL 2 MG/2ML IJ SOLN
INTRAMUSCULAR | Status: DC | PRN
  Administered 2019-09-22 (×2): 1 mg via INTRAVENOUS

## 2019-09-22 MED ORDER — FENTANYL CITRATE (PF) 100 MCG/2ML IJ SOLN
INTRAMUSCULAR | Status: DC | PRN
Start: 2019-09-22 — End: 2019-09-22
  Administered 2019-09-22 (×2): 25 ug via INTRAVENOUS

## 2019-09-22 MED ORDER — VERAPAMIL HCL 2.5 MG/ML IV SOLN
INTRAVENOUS | Status: AC
  Filled 2019-09-22: qty 2

## 2019-09-22 MED ORDER — SODIUM CHLORIDE 0.9 % WEIGHT BASED INFUSION
3.0000 mL/kg/h | INTRAVENOUS | Status: AC
  Administered 2019-09-22: 3 mL/kg/h via INTRAVENOUS

## 2019-09-22 MED ORDER — SODIUM CHLORIDE 0.9% FLUSH: 3.0000 mL | INTRAVENOUS | Status: DC | PRN

## 2019-09-22 MED ORDER — HEPARIN (PORCINE) IN NACL 1000-0.9 UT/500ML-% IV SOLN
INTRAVENOUS | Status: AC
  Filled 2019-09-22: qty 1000

## 2019-09-22 MED ORDER — SODIUM CHLORIDE 0.9% FLUSH: 3.0000 mL | Freq: Two times a day (BID) | INTRAVENOUS | Status: DC

## 2019-09-22 MED ORDER — HEPARIN SODIUM (PORCINE) 1000 UNIT/ML IJ SOLN
INTRAMUSCULAR | Status: AC
  Filled 2019-09-22: qty 1

## 2019-09-22 SURGICAL SUPPLY — 13 items
CATH BALLN WEDGE 5F 110CM (CATHETERS) ×1 IMPLANT
CATH INFINITI 5FR MULTPACK ANG (CATHETERS) ×1 IMPLANT
DEVICE RAD TR BAND REGULAR (VASCULAR PRODUCTS) ×1 IMPLANT
GLIDESHEATH SLEND SS 6F .021 (SHEATH) ×1 IMPLANT
GUIDEWIRE .025 260CM (WIRE) ×1 IMPLANT
GUIDEWIRE INQWIRE 1.5J.035X260 (WIRE) IMPLANT
INQWIRE 1.5J .035X260CM (WIRE) ×2
KIT HEART LEFT (KITS) ×2 IMPLANT
PACK CARDIAC CATHETERIZATION (CUSTOM PROCEDURE TRAY) ×2 IMPLANT
SHEATH GLIDE SLENDER 4/5FR (SHEATH) ×1 IMPLANT
SHEATH PROBE COVER 6X72 (BAG) ×1 IMPLANT
TRANSDUCER W/STOPCOCK (MISCELLANEOUS) ×2 IMPLANT
TUBING CIL FLEX 10 FLL-RA (TUBING) ×2 IMPLANT

## 2019-09-22 NOTE — Addendum Note (Signed)
Addended by: Maren Beach, Hussain Maimone A on: 09/22/2019 05:23 PM   Modules accepted: Orders

## 2019-09-22 NOTE — Discharge Instructions (Signed)
Radial Site Care  This sheet gives you information about how to care for yourself after your procedure. Your health care provider may also give you more specific instructions. If you have problems or questions, contact your health care provider. What can I expect after the procedure? After the procedure, it is common to have:  Bruising and tenderness at the catheter insertion area. Follow these instructions at home: Medicines  Take over-the-counter and prescription medicines only as told by your health care provider. Insertion site care  Follow instructions from your health care provider about how to take care of your insertion site. Make sure you: ? Wash your hands with soap and water before you change your bandage (dressing). If soap and water are not available, use hand sanitizer. ? Change your dressing as told by your health care provider. ? Leave stitches (sutures), skin glue, or adhesive strips in place. These skin closures may need to stay in place for 2 weeks or longer. If adhesive strip edges start to loosen and curl up, you may trim the loose edges. Do not remove adhesive strips completely unless your health care provider tells you to do that.  Check your insertion site every day for signs of infection. Check for: ? Redness, swelling, or pain. ? Fluid or blood. ? Pus or a bad smell. ? Warmth.  Do not take baths, swim, or use a hot tub until your health care provider approves.  You may shower 24-48 hours after the procedure, or as directed by your health care provider. ? Remove the dressing and gently wash the site with plain soap and water. ? Pat the area dry with a clean towel. ? Do not rub the site. That could cause bleeding.  Do not apply powder or lotion to the site. Activity   For 24 hours after the procedure, or as directed by your health care provider: ? Do not flex or bend the affected arm. ? Do not push or pull heavy objects with the affected arm. ? Do not  drive yourself home from the hospital or clinic. You may drive 24 hours after the procedure unless your health care provider tells you not to. ? Do not operate machinery or power tools.  Do not lift anything that is heavier than 10 lb (4.5 kg), or the limit that you are told, until your health care provider says that it is safe.  Ask your health care provider when it is okay to: ? Return to work or school. ? Resume usual physical activities or sports. ? Resume sexual activity. General instructions  If the catheter site starts to bleed, raise your arm and put firm pressure on the site. If the bleeding does not stop, get help right away. This is a medical emergency.  If you went home on the same day as your procedure, a responsible adult should be with you for the first 24 hours after you arrive home.  Keep all follow-up visits as told by your health care provider. This is important. Contact a health care provider if:  You have a fever.  You have redness, swelling, or yellow drainage around your insertion site. Get help right away if:  You have unusual pain at the radial site.  The catheter insertion area swells very fast.  The insertion area is bleeding, and the bleeding does not stop when you hold steady pressure on the area.  Your arm or hand becomes pale, cool, tingly, or numb. These symptoms may represent a serious problem   that is an emergency. Do not wait to see if the symptoms will go away. Get medical help right away. Call your local emergency services (911 in the [place] Do not drive yourself to the hospital. Summary  After the procedure, it is common to have bruising and tenderness at the site.  Follow instructions from your health care provider about how to take care of your radial site wound. Check the wound every day for signs of infection.  Do not lift anything that is heavier than 10 lb (4.5 kg), or the limit that you are told, until your health care provider says  that it is safe. This information is not intended to replace advice given to you by your health care provider. Make sure you discuss any questions you have with your health care provider. Document Revised: 02/10/2017 Document Reviewed: 02/10/2017 Elsevier Patient Education  2020 [employer]  

## 2019-09-22 NOTE — Interval H&P Note (Signed)
History and Physical Interval Note:  09/22/2019 7:21 AM  Jasmine Blanchard  has presented today for surgery, with the diagnosis of Shortness of Breath.  The various methods of treatment have been discussed with the patient and family. After consideration of risks, benefits and other options for treatment, the patient has consented to  Procedure(s): RIGHT/LEFT HEART CATH AND CORONARY ANGIOGRAPHY (N/A) as a surgical intervention.  The patient's history has been reviewed, patient examined, no change in status, stable for surgery.  I have reviewed the patient's chart and labs.  Questions were answered to the patient's satisfaction.   Cath Lab Visit (complete for each Cath Lab visit)  Clinical Evaluation Leading to the Procedure:   ACS: Yes.    Non-ACS:    Anginal Classification: CCS III  Anti-ischemic medical therapy: No Therapy  Non-Invasive Test Results: No non-invasive testing performed  Prior CABG: No previous CABG        [name] [facility] 09/22/2019 7:21 AM

## 2019-09-26 ENCOUNTER — Telehealth: Payer: Self-pay | Admitting: *Deleted

## 2019-09-26 DIAGNOSIS — E7800 Pure hypercholesterolemia, unspecified: Secondary | ICD-10-CM

## 2019-09-26 DIAGNOSIS — Z789 Other specified health status: Secondary | ICD-10-CM

## 2019-09-26 DIAGNOSIS — E785 Hyperlipidemia, unspecified: Secondary | ICD-10-CM

## 2019-09-26 DIAGNOSIS — I25119 Atherosclerotic heart disease of native coronary artery with unspecified angina pectoris: Secondary | ICD-10-CM

## 2019-09-26 DIAGNOSIS — I2584 Coronary atherosclerosis due to calcified coronary lesion: Secondary | ICD-10-CM

## 2019-09-26 NOTE — Telephone Encounter (Signed)
Per Dr Marlou Porch - he would like pt to be seen in the [facility].  Will contact patient and place order.

## 2019-09-28 NOTE — Telephone Encounter (Signed)
   Ost Cx to Prox Cx lesion is 50% stenosed.  Ost RCA lesion is 30% stenosed.  Mid RCA lesion is 50% stenosed.  The left ventricular systolic function is normal.  LV end diastolic pressure is normal.  The left ventricular ejection fraction is greater than 65% by visual estimate.   1. Nonobstructive CAD. 50% ostial LCx and mid RCA 2. Hyperdynamic LV systolic function. 3. Normal LV filling pressures 4. Normal right heart pressures 5. Normal cardiac output.  Plan: medical management and risk factor modification.  Left message for pt on private voicemail to call back to discuss being scheduled in the Lipid Clinic.  Pt does take Fenofibrate and Omega 3s but had not been able in the past to tolerate statins.  Based on her cath results she does need further management/treatment of lipids as prevention of CAD - hence the referral to the Lipid Clinic.

## 2019-09-29 NOTE — Telephone Encounter (Signed)
Follow up:     Patient returning a call back from yesterday. Please call patient back.

## 2019-09-29 NOTE — Telephone Encounter (Signed)
RE: refer to lipid clinic per Dr. Marlou Porch Received: Today Jerlyn Ly, LPN Cc: Shellia Cleverly, RN Schedule for 10-17-19 @ 3 pm after MD appt @ 2:40/sf

## 2019-09-29 NOTE — Telephone Encounter (Signed)
Spoke with the pt and went over plan with her, about further preventitive management she will need for her known CAD, statin intolerance, and recent cath results, per Dr. Marlou Porch.  Informed the pt that Dr. Marlou Porch would like to refer her to our lipid clinic, so that they can further manage her lipids, and assist in getting her on a more tolerable regimen.   Informed the pt that that I will place the referral to our lipid clinic in the system, and if she is interested, I will send them a message to call her back, and arrange her consult appt with them. Pt verbalized understanding and agrees with this plan.  She would like to be seen in our lipid clinic. [facility] or PharmD to call the pt back and arrange this appt.

## 2019-10-09 ENCOUNTER — Other Ambulatory Visit: Payer: Self-pay

## 2019-10-09 ENCOUNTER — Ambulatory Visit (HOSPITAL_COMMUNITY): Payer: Medicare Other | Attending: Cardiology

## 2019-10-09 DIAGNOSIS — R0602 Shortness of breath: Secondary | ICD-10-CM | POA: Diagnosis present

## 2019-10-09 LAB — ECHOCARDIOGRAM COMPLETE
Area-P 1/2: 2.99 cm2
S' Lateral: 2.1 cm

## 2019-10-09 MED ORDER — PERFLUTREN LIPID MICROSPHERE
1.0000 mL | INTRAVENOUS | Status: AC | PRN
  Administered 2019-10-09: 2 mL via INTRAVENOUS

## 2019-10-16 NOTE — Progress Notes (Signed)
Patient ID: Jasmine Blanchard                 DOB: 01/21/1950                    MRN: [id number]     HPI: Jasmine Blanchard is a 69 y.o. female patient referred to lipid clinic by Dr. Marlou Porch. PMH is significant for nonobstructive CAD and HLD. Coronary calcium score 344 (90th percentile) on 07/29/2017. Heart cath on 09/22/2019 showed ost Cx prox 50% stenosed, ost RCA 30% stenosed, and mid RCA 50% stenosed.  Patient presents to lipid clinic today for follow-up. She reports having body aches on rosuvastatin in the past but does not remember which dose she was on. She does not recall taking any other statins before. Patient reports she was taking both fenofibrate and fish oil in 06/2019 when her lipids were drawn (has been taking fenofibrate for about 10 years now).  Patient reports getting physical activity through yoga 3 times weekly and walking occasionally. Patient also has a relatively healthy diet with plenty of vegetables, some protein, and very limited complex carbs. However, patient does enjoy sweets and drinking red wine (currently has 2-3 glasses of red wine every evening). Patient continues to smoke 5 cigarettes daily but is working on cutting back and is now smoking her first cigarette later in the day (waits a couple hours).  Current Medications: Fenofibrate 160 mg daily. Omega-3 fatty acids 1200 mg daily. Intolerances: Rosuvastatin (myalgias) Risk Factors: CAD with calcification, HLD LDL goal: <70 mg/dL  Diet: Breakfast: leftovers, egg, toast, shrimp cocktail Lunch: salad Dinner: limits to 4 oz protein (beef, pork, mostly chicken); salad, vegetables; not a lot of complex carbs (pasta, potatoes); does not eat big meals; enjoys sweets and candy; enjoys cheese Drinks 2-3 glasses of red wine; limits herself to 1 soft drink daily, lots of water all day long  Exercise: Fairly active, does yoga 3 times weekly, walks occasionally but not much recently because of heat and busyness  Family History:  Heart attack (father, age 71); heart disease (paternal grandfather)  Social History: Single. Current smoker; has cut down to 5 cigarettes daily; working on quitting; does not like to chew gum but is interested in trying nicotine patch  Labs: Lipid panel (07/11/19): TC 251, LDL 161, HDL 65, TG 124 [on fenofibrate 160 mg and fish oil]  Past Medical History:  Diagnosis Date  . Aortic atherosclerosis ([place])   . Blood pressure elevated without history of HTN   . Breast cancer ([place]) 07/16/2016   right ER/PR positive, grade 1 invasive ductal carcinoma  . Cigarette smoker   . Coronary atherosclerosis due to calcified coronary lesion   . Dental crowns present    also caps and dental implants  . Depression   . Emphysema lung ([place])   . Family history of early CAD   . Hyperlipidemia   . Immature cataract    Patient denies  . Onychomycosis   . Osteopenia   . Osteoporosis   . PONV (postoperative nausea and vomiting)   . Skin mole   . Vitamin D deficiency     Current Outpatient Medications on File Prior to Visit  Medication Sig Dispense Refill  . aspirin 81 MG tablet Take 81 mg by mouth daily.    . Biotin 1000 MCG tablet Take 1,000 mcg by mouth daily.     Marland Kitchen CALCIUM-MAGNESIUM-VITAMIN D PO Take 1 tablet by mouth daily.     . Coenzyme  Q10 (CO Q-10) 200 MG CAPS Take 200 mg by mouth daily.     . fenofibrate 160 MG tablet Take 160 mg by mouth daily.    . Menaquinone-7 (VITAMIN K2 PO) Take 1 tablet by mouth daily. MK7    . Multiple Vitamins-Minerals (EMERGEN-C IMMUNE) PACK Take 1,000 mg by mouth daily.    . Omega-3 Fatty Acids (FISH OIL OMEGA-3 PO) Take 1,200 mg by mouth daily.      No current facility-administered medications on file prior to visit.    No Known Allergies  Assessment/Plan:  1. Hyperlipidemia - Patient's LDL is above goal of <70 mg/dL, not currently on a statin. Patient has had prior intolerance to rosuvastatin due to myalgias. We will initiate pravastatin 40 mg daily to see  how patient tolerates this. Plan to titrate to max tolerated dose of statin, then consider adding PCSK9 inhibitor for additional LDL-lowering benefit if still not at goal. Patient is willing to try this but is not confident that she will be able to tolerate the statin. Counseled patient on the clinical benefits of statins, as well as the costs vs benefits of PCSK9 inhibitors. Patient can continue her fish oil and discontinue fenofibrate. Counseled patient that fenofibrate does not have cardiovascular risk reduction data, and patient agreed to stop taking it. Although her triglyceride levels may have been elevated in the past, patient can help lower TG by limiting alcohol and sweets intake. Also counseled patient to increase her physical activity by taking daily walks. Plan to draw fasting lipid panel in 8 weeks on 11/22.  2. Smoking cessation - Patient is currently smoking 5 cigarettes per day and is interested in using nicotine patch to quit smoking. Since patient smokes <10 cigarettes per day we recommend patient to start with the 14-mg patch for 6 weeks, then decrease to the 7-mg patch for 2 weeks. Patient expressed understanding of this plan.    Thank you,  Esmeralda Links (PharmD Candidate 2022)  Ramond Dial, Pharm.D, BCPS, CPP [place]  [address], [place], [place] [address]  Phone: [phone]; Fax: [phone]

## 2019-10-17 ENCOUNTER — Ambulatory Visit: Payer: Medicare Other | Admitting: Cardiology

## 2019-10-17 ENCOUNTER — Ambulatory Visit (INDEPENDENT_AMBULATORY_CARE_PROVIDER_SITE_OTHER): Payer: Medicare Other | Admitting: Pharmacist

## 2019-10-17 ENCOUNTER — Other Ambulatory Visit: Payer: Self-pay

## 2019-10-17 ENCOUNTER — Encounter: Payer: Self-pay | Admitting: Cardiology

## 2019-10-17 VITALS — BP 158/80 | HR 105 | Ht 60.0 in | Wt 114.0 lb

## 2019-10-17 DIAGNOSIS — R0602 Shortness of breath: Secondary | ICD-10-CM

## 2019-10-17 DIAGNOSIS — E7800 Pure hypercholesterolemia, unspecified: Secondary | ICD-10-CM

## 2019-10-17 DIAGNOSIS — I25119 Atherosclerotic heart disease of native coronary artery with unspecified angina pectoris: Secondary | ICD-10-CM

## 2019-10-17 DIAGNOSIS — Z789 Other specified health status: Secondary | ICD-10-CM

## 2019-10-17 MED ORDER — PRAVASTATIN SODIUM 40 MG PO TABS: 40.0000 mg | ORAL_TABLET | Freq: Every evening | ORAL | 3 refills | Status: DC

## 2019-10-17 NOTE — Patient Instructions (Signed)
Medication Instructions:  The current medical regimen is effective;  continue present plan and medications.  *If you need a refill on your cardiac medications before your next appointment, please call your pharmacy*  You have been referred to [facility] for further evaluation of your shortness of breath.  Follow-Up: At [facility], you and your health needs are our priority.  As part of our continuing mission to provide you with exceptional heart care, we have created designated Provider Care Teams.  These Care Teams include your primary Cardiologist (physician) and Advanced Practice Providers (APPs -  Physician Assistants and Nurse Practitioners) who all work together to provide you with the care you need, when you need it.  We recommend signing up for the patient portal called "MyChart".  Sign up information is provided on this After Visit Summary.  MyChart is used to connect with patients for Virtual Visits (Telemedicine).  Patients are able to view lab/test results, encounter notes, upcoming appointments, etc.  Non-urgent messages can be sent to your provider as well.   To learn more about what you can do with MyChart, go to [email or web address].    Your next appointment:   6 month(s)  The format for your next appointment:   In Person  Provider:   Candee Furbish, MD   Thank you for choosing [facility]!!

## 2019-10-17 NOTE — Patient Instructions (Addendum)
Great to see you today!  Your LDL cholesterol is currently 161. Our goal is to reduce your LDL to <70 in order to minimize your risk of heart attacks and strokes.  We would like to START pravastatin 40 mg daily. Please contact us if you have any intolerable side effects, such as muscle cramps. We will see how your cholesterol does on pravastatin to determine what our next steps will be.  You can STOP taking fenofibrate.  Other ways to help lower your cholesterol include reducing the amount of soda and alcohol you drink (limit to 1 glass of red wine daily), reducing the number of cigarettes that you smoke, and increasing the amount of moderate-intensity exercise you get every day, such as taking daily walks for 20-30 min.  For the nicotine patch, we recommend the 14 mg patch for 6 weeks, then the 7 mg patch for 2 weeks.  We will check your fasting lipids again in 8 weeks on 12/11/2019.   Please call us if you have any questions or concerns: [phone]

## 2019-10-17 NOTE — Progress Notes (Signed)
Cardiology Office Note:    Date:  10/17/2019   ID:  Jasmine Blanchard, DOB 08/16/1950, MRN [id number]  PCP:  Shon Baton, MD  [facility] Cardiologist:  Candee Furbish, MD  [facility] Electrophysiologist:  None   Referring MD: Shon Baton, MD     History of Present Illness:    Jasmine Blanchard is a 69 y.o. female here for follow-up of nonobstructive calcified plaque, coronary artery disease.  LDL cholesterol 161 triglycerides 124.  Creatinine 0.7, TSH 0.4  Overall still having some shortness of breath.  Referring to pulmonary clinic.  Still smoking.  8 cigarettes to 5 cigarettes a day.  Discussed smoking cessation strategies.   Echocardiogram 10/09/2019: 1. Left ventricular ejection fraction, by estimation, is 60 to 65%. The  left ventricle has normal function. The left ventricle has no regional  wall motion abnormalities. Left ventricular diastolic parameters were  normal.  2. Right ventricular systolic function is normal. The right ventricular  size is normal. Tricuspid regurgitation signal is inadequate for assessing  PA pressure.  3. The mitral valve is normal in structure. Trivial mitral valve  regurgitation. No evidence of mitral stenosis.  4. The aortic valve is grossly normal. Aortic valve regurgitation is not  visualized. No aortic stenosis is present.  5. The inferior vena cava is normal in size with greater than 50%  respiratory variability, suggesting right atrial pressure of 3 mmHg.   Cardiac catheterization 09/22/2019:   Ost Cx to Prox Cx lesion is 50% stenosed.  Ost RCA lesion is 30% stenosed.  Mid RCA lesion is 50% stenosed.  The left ventricular systolic function is normal.  LV end diastolic pressure is normal.  The left ventricular ejection fraction is greater than 65% by visual estimate.   1. Nonobstructive CAD. 50% ostial LCx and mid RCA 2. Hyperdynamic LV systolic function. 3. Normal LV filling pressures 4. Normal right heart pressures 5.  Normal cardiac output.  Plan: medical management and risk factor modification.  Had coronary calcium score of 344, 90th percentile.    Past Medical History:  Diagnosis Date  . Aortic atherosclerosis ([place])   . Blood pressure elevated without history of HTN   . Breast cancer ([place]) 07/16/2016   right ER/PR positive, grade 1 invasive ductal carcinoma  . Cigarette smoker   . Coronary atherosclerosis due to calcified coronary lesion   . Dental crowns present    also caps and dental implants  . Depression   . Emphysema lung ([place])   . Family history of early CAD   . Hyperlipidemia   . Immature cataract    Patient denies  . Onychomycosis   . Osteopenia   . Osteoporosis   . PONV (postoperative nausea and vomiting)   . Skin mole   . Vitamin D deficiency     Past Surgical History:  Procedure Laterality Date  . AUGMENTATION MAMMAPLASTY    . BREAST ENHANCEMENT SURGERY  1987; 1995  . BREAST LUMPECTOMY WITH RADIOACTIVE SEED AND SENTINEL LYMPH NODE BIOPSY Right 08/05/2016   Procedure: RIGHT BREAST LUMPECTOMY WITH RADIOACTIVE SEED AND RIGHT AXILLARY  SENTINEL LYMPH NODE BIOPSY;  Surgeon: Alphonsa Overall, MD;  Location: [facility];  Service: General;  Laterality: Right;  . COLONOSCOPY WITH PROPOFOL  08/05/2012  . RIGHT/LEFT HEART CATH AND CORONARY ANGIOGRAPHY N/A 09/22/2019   Procedure: RIGHT/LEFT HEART CATH AND CORONARY ANGIOGRAPHY;  Surgeon: Martinique, Peter M, MD;  Location: [facility] CV [facility];  Service: Cardiovascular;  Laterality: N/A;    Current  Medications: Current Meds  Medication Sig  . aspirin 81 MG tablet Take 81 mg by mouth daily.  . Biotin 1000 MCG tablet Take 1,000 mcg by mouth daily.   Marland Kitchen CALCIUM-MAGNESIUM-VITAMIN D PO Take 1 tablet by mouth daily.   . Coenzyme Q10 (CO Q-10) 200 MG CAPS Take 200 mg by mouth daily.   . fenofibrate 160 MG tablet Take 160 mg by mouth daily.  . Menaquinone-7 (VITAMIN K2 PO) Take 1 tablet by mouth daily. MK7  . Multiple  Vitamins-Minerals (EMERGEN-C IMMUNE) PACK Take 1,000 mg by mouth daily.  . Omega-3 Fatty Acids (FISH OIL OMEGA-3 PO) Take 1,200 mg by mouth daily.      Allergies:   Patient has no known allergies.   Social History   Socioeconomic History  . Marital status: Single    Spouse name: Not on file  . Number of children: Not on file  . Years of education: Not on file  . Highest education level: Not on file  Occupational History  . Not on file  Tobacco Use  . Smoking status: Current Every Day Smoker    Packs/day: 0.50    Years: 40.00    Pack years: 20.00    Types: Cigarettes  . Smokeless tobacco: Never Used  Vaping Use  . Vaping Use: Never used  Substance and Sexual Activity  . Alcohol use: Yes    Alcohol/week: 10.0 standard drinks    Types: 10 Glasses of wine per week  . Drug use: No  . Sexual activity: Not Currently    Partners: Male    Birth control/protection: Post-menopausal  Other Topics Concern  . Not on file  Social History Narrative  . Not on file   Social Determinants of Health   Financial Resource Strain:   . Difficulty of Paying Living Expenses: Not on file  Food Insecurity:   . Worried About Charity fundraiser in the Last Year: Not on file  . Ran Out of Food in the Last Year: Not on file  Transportation Needs:   . Lack of Transportation (Medical): Not on file  . Lack of Transportation (Non-Medical): Not on file  Physical Activity:   . Days of Exercise per Week: Not on file  . Minutes of Exercise per Session: Not on file  Stress:   . Feeling of Stress : Not on file  Social Connections:   . Frequency of Communication with Friends and Family: Not on file  . Frequency of Social Gatherings with Friends and Family: Not on file  . Attends Religious Services: Not on file  . Active Member of Clubs or Organizations: Not on file  . Attends Archivist Meetings: Not on file  . Marital Status: Not on file     Family History: The patient's family history  includes Breast cancer in her mother; Heart attack in her father; Heart disease in her mother. There is no history of Colon cancer, Esophageal cancer, Rectal cancer, or Stomach cancer.  Recent Labs: 09/14/2019: BUN 17; Creatinine, Ser 0.71; Platelets 349 09/22/2019: Hemoglobin 13.6; Potassium 3.7; Sodium 142  Recent Lipid Panel No results found for: CHOL, TRIG, HDL, CHOLHDL, VLDL, LDLCALC, LDLDIRECT  Physical Exam:    VS:  BP (!) 158/80   Pulse (!) 105   Ht 5' (1.524 m)   Wt 114 lb (51.7 kg)   LMP 12/19/2000 (Approximate)   SpO2 96%   BMI 22.26 kg/m     Wt Readings from Last 3 Encounters:  10/17/19 114  lb (51.7 kg)  09/22/19 110 lb (49.9 kg)  09/14/19 112 lb (50.8 kg)     GEN:  Well nourished, well developed in no acute distress HEENT: Normal NECK: No JVD; No carotid bruits LYMPHATICS: No lymphadenopathy CARDIAC: RRR, no murmurs, rubs, gallops RESPIRATORY:  Clear to auscultation without rales, wheezing or rhonchi  ABDOMEN: Soft, non-tender, non-distended MUSCULOSKELETAL:  No edema; No deformity  SKIN: Warm and dry NEUROLOGIC:  Alert and oriented x 3 PSYCHIATRIC:  Normal affect   ASSESSMENT:    1. Coronary artery disease involving native heart with angina pectoris, unspecified vessel or lesion type ([place])   2. SOB (shortness of breath)   3. Statin intolerance    PLAN:    In order of problems listed above:  Coronary artery disease -Nonobstructive per cardiac catheterization as above.  Personally reviewed images.  We will continue with aggressive secondary risk factor prevention.  She has had trouble in the past with statins.  We will go ahead and have her see the lipid clinic today.  Excellent.  Hyperlipidemia -As above, LDL in the 160s.  Lipid clinic.  Goal LDL less than 70.  Shortness of breath -Longstanding tobacco history.  Continue to encourage cessation.  We discussed tobacco cessation at length, tricks, tactics. -We will refer to pulmonary clinic for further  evaluation of lung function.  6 mth f/u   Medication Adjustments/Labs and Tests Ordered: Current medicines are reviewed at length with the patient today.  Concerns regarding medicines are outlined above.  Orders Placed This Encounter  Procedures  . Ambulatory referral to Pulmonology   No orders of the defined types were placed in this encounter.   Patient Instructions  Medication Instructions:  The current medical regimen is effective;  continue present plan and medications.  *If you need a refill on your cardiac medications before your next appointment, please call your pharmacy*  You have been referred to [facility] for further evaluation of your shortness of breath.  Follow-Up: At [facility], you and your health needs are our priority.  As part of our continuing mission to provide you with exceptional heart care, we have created designated Provider Care Teams.  These Care Teams include your primary Cardiologist (physician) and Advanced Practice Providers (APPs -  Physician Assistants and Nurse Practitioners) who all work together to provide you with the care you need, when you need it.  We recommend signing up for the patient portal called "MyChart".  Sign up information is provided on this After Visit Summary.  MyChart is used to connect with patients for Virtual Visits (Telemedicine).  Patients are able to view lab/test results, encounter notes, upcoming appointments, etc.  Non-urgent messages can be sent to your provider as well.   To learn more about what you can do with MyChart, go to [email or web address].    Your next appointment:   6 month(s)  The format for your next appointment:   In Person  Provider:   Candee Furbish, MD   Thank you for choosing [facility]!!        Signed, Candee Furbish, MD  10/17/2019 3:37 PM    [place]

## 2019-11-20 ENCOUNTER — Institutional Professional Consult (permissible substitution): Payer: Medicare Other | Admitting: Pulmonary Disease

## 2019-12-11 ENCOUNTER — Other Ambulatory Visit: Payer: Medicare Other

## 2019-12-12 ENCOUNTER — Telehealth: Payer: Self-pay | Admitting: Pharmacist

## 2019-12-12 NOTE — Telephone Encounter (Signed)
Called pt because she canceled her lab appointment due to not tolerating pravastatin. Asked if she would like to discuss alternatives. She states that she would but not until the new year. Requested that I call her in 2022.

## 2019-12-25 ENCOUNTER — Ambulatory Visit: Payer: Medicare Other | Admitting: Pulmonary Disease

## 2019-12-25 ENCOUNTER — Encounter: Payer: Self-pay | Admitting: Pulmonary Disease

## 2019-12-25 ENCOUNTER — Other Ambulatory Visit: Payer: Self-pay

## 2019-12-25 VITALS — BP 160/80 | HR 105 | Temp 98.5°F | Ht 60.0 in | Wt 111.6 lb

## 2019-12-25 DIAGNOSIS — Z720 Tobacco use: Secondary | ICD-10-CM | POA: Diagnosis not present

## 2019-12-25 DIAGNOSIS — R0602 Shortness of breath: Secondary | ICD-10-CM

## 2019-12-25 MED ORDER — NICOTINE 7 MG/24HR TD PT24: 7.0000 mg | MEDICATED_PATCH | Freq: Every day | TRANSDERMAL | 3 refills | Status: DC

## 2019-12-25 MED ORDER — ALBUTEROL SULFATE HFA 108 (90 BASE) MCG/ACT IN AERS: 2.0000 | INHALATION_SPRAY | Freq: Four times a day (QID) | RESPIRATORY_TRACT | 6 refills | Status: DC | PRN

## 2019-12-25 NOTE — Patient Instructions (Signed)
Start Spiriva respimat inhaler 2.47mcg, 1 puff daily - Please call our office if you notice benefit and we will send in a prescription  Use albuterol inhaler 1-2 puffs as needed every 4-6 hours for shortness of breath, cough or wheezing  Start nicotine patches 7mg  daily along with as needed nicotine replacement lozenges or gum for break through cravings.   Follow up in 4 months with pulmonary function testing.

## 2019-12-25 NOTE — Progress Notes (Signed)
Synopsis: Referred by Candee Furbish, MD for shortness of breath  Subjective:   PATIENT ID: Jasmine Blanchard GENDER: female DOB: 06-10-1950, MRN: [id number]   HPI  Chief Complaint  Patient presents with  . Consult    sob x 6 months   Jasmine Blanchard is a 69 year old woman, daily smoker with history of breast cancer and coronary artery disease who is referred to pulmonary clinic for evaluation of shortness of breath.  She recently underwent cardiac workup with heart cath and echo in 09/2019 with normal findings on the echo and non-obstructive coronary disease on the cath. She has been having shortness of breath for 6 months. The shortness of breath is most notable with climbing stairs with groceries. She is able to complete a set of stairs without stopping though. She has occasional cough with sputum production. She denies wheezing.     She smokes about 5 cigarettes daily. She has been cutting back on her own from 1 pack per day and has had difficulty being able to completely quit.    Past Medical History:  Diagnosis Date  . Aortic atherosclerosis ([place])   . Blood pressure elevated without history of HTN   . Breast cancer ([place]) 07/16/2016   right ER/PR positive, grade 1 invasive ductal carcinoma  . Cigarette smoker   . Coronary atherosclerosis due to calcified coronary lesion   . Dental crowns present    also caps and dental implants  . Depression   . Emphysema lung ([place])   . Family history of early CAD   . Hyperlipidemia   . Immature cataract    Patient denies  . Onychomycosis   . Osteopenia   . Osteoporosis   . PONV (postoperative nausea and vomiting)   . Skin mole   . Vitamin D deficiency      Family History  Problem Relation Age of Onset  . Breast cancer Mother   . Heart disease Mother   . Heart attack Father   . Colon cancer Neg Hx   . Esophageal cancer Neg Hx   . Rectal cancer Neg Hx   . Stomach cancer Neg Hx      Social History   Socioeconomic History  .  Marital status: Single    Spouse name: Not on file  . Number of children: Not on file  . Years of education: Not on file  . Highest education level: Not on file  Occupational History  . Not on file  Tobacco Use  . Smoking status: Current Every Day Smoker    Packs/day: 1.25    Years: 40.00    Pack years: 50.00    Types: Cigarettes    Start date: 12/25/1970  . Smokeless tobacco: Never Used  . Tobacco comment: 5-6 cigarettes/day  Vaping Use  . Vaping Use: Never used  Substance and Sexual Activity  . Alcohol use: Yes    Alcohol/week: 10.0 standard drinks    Types: 10 Glasses of wine per week  . Drug use: No  . Sexual activity: Not Currently    Partners: Male    Birth control/protection: Post-menopausal  Other Topics Concern  . Not on file  Social History Narrative  . Not on file   Social Determinants of Health   Financial Resource Strain: Not on file  Food Insecurity: Not on file  Transportation Needs: Not on file  Physical Activity: Not on file  Stress: Not on file  Social Connections: Not on file  Intimate Partner Violence: Not  on file     No Known Allergies   Outpatient Medications Prior to Visit  Medication Sig Dispense Refill  . aspirin 81 MG tablet Take 81 mg by mouth daily.    . Biotin 1000 MCG tablet Take 1,000 mcg by mouth daily.     Marland Kitchen CALCIUM-MAGNESIUM-VITAMIN D PO Take 1 tablet by mouth daily.     . Coenzyme Q10 (CO Q-10) 200 MG CAPS Take 200 mg by mouth daily.     . Menaquinone-7 (VITAMIN K2 PO) Take 1 tablet by mouth daily. MK7    . Multiple Vitamins-Minerals (EMERGEN-C IMMUNE) PACK Take 1,000 mg by mouth daily.    . Omega-3 Fatty Acids (FISH OIL OMEGA-3 PO) Take 1,200 mg by mouth daily.     . pravastatin (PRAVACHOL) 40 MG tablet Take 1 tablet (40 mg total) by mouth every evening. 90 tablet 3   No facility-administered medications prior to visit.    Review of Systems  Constitutional: Negative for chills, fever, malaise/fatigue and weight loss.   HENT: Negative for congestion and sore throat.   Eyes: Negative.   Respiratory: Positive for shortness of breath. Negative for hemoptysis.   Cardiovascular: Negative for chest pain, palpitations, orthopnea, claudication and leg swelling.  Gastrointestinal: Negative for abdominal pain, heartburn, nausea and vomiting.  Genitourinary: Negative.   Musculoskeletal: Negative.   Neurological: Negative for dizziness, weakness and headaches.  Psychiatric/Behavioral: Negative.    Objective:   Vitals:   12/25/19 0909  BP: (!) 160/80  Pulse: (!) 105  Temp: 98.5 F (36.9 C)  TempSrc: Temporal  SpO2: 93%  Weight: 111 lb 9.6 oz (50.6 kg)  Height: 5' (1.524 m)     Physical Exam Constitutional:      General: She is not in acute distress.    Appearance: Normal appearance. She is not ill-appearing.  HENT:     Head: Normocephalic and atraumatic.     Nose: Nose normal.     Mouth/Throat:     Mouth: Mucous membranes are moist.     Pharynx: Oropharynx is clear.  Eyes:     General: No scleral icterus.    Conjunctiva/sclera: Conjunctivae normal.     Pupils: Pupils are equal, round, and reactive to light.  Cardiovascular:     Rate and Rhythm: Normal rate and regular rhythm.     Pulses: Normal pulses.     Heart sounds: Normal heart sounds. No murmur heard.   Pulmonary:     Effort: Pulmonary effort is normal.     Breath sounds: No wheezing, rhonchi or rales.  Abdominal:     General: Bowel sounds are normal.     Palpations: Abdomen is soft.  Musculoskeletal:     Right lower leg: No edema.     Left lower leg: No edema.  Skin:    General: Skin is warm and dry.     Capillary Refill: Capillary refill takes less than 2 seconds.  Neurological:     Mental Status: She is alert.  Psychiatric:        Mood and Affect: Mood normal.        Behavior: Behavior normal.        Thought Content: Thought content normal.        Judgment: Judgment normal.     CBC    Component Value Date/Time    WBC 6.2 09/14/2019 1215   WBC 7.0 07/29/2016 0841   RBC 4.43 09/14/2019 1215   RBC 4.40 07/29/2016 0841   HGB 13.6 09/22/2019 0759  HGB 14.0 09/14/2019 1215   HGB 14.5 07/29/2016 0841   HCT 40.0 09/22/2019 0759   HCT 39.4 09/14/2019 1215   HCT 41.4 07/29/2016 0841   PLT 349 09/14/2019 1215   MCV 89 09/14/2019 1215   MCV 94.0 07/29/2016 0841   MCH 31.6 09/14/2019 1215   MCH 32.9 07/29/2016 0841   MCHC 35.5 09/14/2019 1215   MCHC 34.9 07/29/2016 0841   RDW 11.8 09/14/2019 1215   RDW 13.2 07/29/2016 0841   LYMPHSABS 1.8 07/29/2016 0841   MONOABS 0.6 07/29/2016 0841   EOSABS 0.1 07/29/2016 0841   BASOSABS 0.0 07/29/2016 0841   BMP Latest Ref Rng & Units 09/22/2019 09/22/2019 09/14/2019  Glucose 65 - 99 mg/dL - - 85  BUN 8 - 27 mg/dL - - 17  Creatinine 0.57 - 1.00 mg/dL - - 0.71  BUN/Creat Ratio 12 - 28 - - 24  Sodium 135 - 145 mmol/L 142 142 140  Potassium 3.5 - 5.1 mmol/L 3.7 3.7 4.3  Chloride 96 - 106 mmol/L - - 103  CO2 20 - 29 mmol/L - - 25  Calcium 8.7 - 10.3 mg/dL - - 10.6(H)   ABG 09/22/19 PH 7.31, pCO2 56.7, pO2 244  Chest imaging: CTA Coronary 07/29/2017 Cardiovascular: Moderate to severe atherosclerosis involving the visualized thoracic and proximal abdominal aorta without evidence of aneurysm.  Mediastinum/Nodes: No pathologic lymphadenopathy within the visualized mediastinum. Visualized esophagus normal in appearance.  Lungs/Pleura: Emphysematous changes within the visualized lung parenchyma. Visualized lung parenchyma clear. Central bronchi patent without significant bronchial wall thickening. No pleural effusions.  Upper Abdomen: Visualized upper abdomen normal for the unenhanced technique.  Musculoskeletal: Osseous demineralization. Mild degenerative disc disease and spondylosis involving midthoracic spine. No acute Findings.  PFT: No flowsheet data found.  Echo: 10/09/19 1. Left ventricular ejection fraction, by estimation, is 60 to 65%. The   left ventricle has normal function. The left ventricle has no regional  wall motion abnormalities. Left ventricular diastolic parameters were  normal.  2. Right ventricular systolic function is normal. The right ventricular  size is normal. Tricuspid regurgitation signal is inadequate for assessing  PA pressure.  3. The mitral valve is normal in structure. Trivial mitral valve  regurgitation. No evidence of mitral stenosis.  4. The aortic valve is grossly normal. Aortic valve regurgitation is not  visualized. No aortic stenosis is present.  5. The inferior vena cava is normal in size with greater than 50%  respiratory variability, suggesting right atrial pressure of 3 mmHg.  Heart Catheterization: 09/22/19  Ost Cx to Prox Cx lesion is 50% stenosed.  Ost RCA lesion is 30% stenosed.  Mid RCA lesion is 50% stenosed.  The left ventricular systolic function is normal.  LV end diastolic pressure is normal.  The left ventricular ejection fraction is greater than 65% by visual estimate.   Assessment & Plan:   Shortness of breath - Plan: Pulmonary Function Test  Tobacco use  Discussion: Jasmine Blanchard is a 69 year old woman, daily smoker with history of breast cancer and coronary artery disease who is referred to pulmonary clinic for evaluation of shortness of breath.  She likely has underlying obstructive lung disease based on her history of smoking, evidence of emphysematous changes on her CT Coronary scan in 2019 along with CO2 retention as noted on her ABG on 09/22/19. We will start her on spiriva daily and obtain pulmonary function tests.   We discussed smoking cessation treatments and she continues to have cravings which make it  difficult to quit the last 5 cigarettes. She is to start nicotine replacement therapy with patches, 7mg  daily and gum/lozenges as needed. She is motivated to quit.  She does qualify for lung cancer screening which we will discuss at follow up  visit.  Follow up in 4 months.  Freda Jackson, MD [facility] Office: [phone]   See Amion for Pager Details      Current Outpatient Medications:  .  aspirin 81 MG tablet, Take 81 mg by mouth daily., Disp: , Rfl:  .  Biotin 1000 MCG tablet, Take 1,000 mcg by mouth daily. , Disp: , Rfl:  .  CALCIUM-MAGNESIUM-VITAMIN D PO, Take 1 tablet by mouth daily. , Disp: , Rfl:  .  Coenzyme Q10 (CO Q-10) 200 MG CAPS, Take 200 mg by mouth daily. , Disp: , Rfl:  .  Menaquinone-7 (VITAMIN K2 PO), Take 1 tablet by mouth daily. MK7, Disp: , Rfl:  .  Multiple Vitamins-Minerals (EMERGEN-C IMMUNE) PACK, Take 1,000 mg by mouth daily., Disp: , Rfl:  .  Omega-3 Fatty Acids (FISH OIL OMEGA-3 PO), Take 1,200 mg by mouth daily. , Disp: , Rfl:  .  albuterol (VENTOLIN HFA) 108 (90 Base) MCG/ACT inhaler, Inhale 2 puffs into the lungs every 6 (six) hours as needed for wheezing or shortness of breath., Disp: 8 g, Rfl: 6 .  nicotine (NICODERM CQ) 7 mg/24hr patch, Place 1 patch (7 mg total) onto the skin daily., Disp: 28 patch, Rfl: 3

## 2020-01-02 ENCOUNTER — Telehealth: Payer: Self-pay | Admitting: Pulmonary Disease

## 2020-01-02 NOTE — Telephone Encounter (Signed)
Called and spoke with [facility]ist at [facility] who asked if they could switch patient to Proair as ventolin is not covered by her insurance. Advised they can change it. Nothing further needed at this time.

## 2020-01-03 ENCOUNTER — Other Ambulatory Visit: Payer: Self-pay | Admitting: *Deleted

## 2020-01-03 MED ORDER — ALBUTEROL SULFATE HFA 108 (90 BASE) MCG/ACT IN AERS: 2.0000 | INHALATION_SPRAY | Freq: Four times a day (QID) | RESPIRATORY_TRACT | 6 refills | Status: DC | PRN

## 2020-01-29 NOTE — Progress Notes (Signed)
Patient Care Team: Shon Baton, MD as PCP - General (Internal Medicine) Jerline Pain, MD as PCP - Cardiology (Cardiology) Alphonsa Overall, MD as Consulting Physician (General Surgery) Nicholas Lose, MD as Consulting Physician (Hematology and Oncology) Kyung Rudd, MD as Consulting Physician (Radiation Oncology)  DIAGNOSIS:    ICD-10-CM   1. Malignant neoplasm of upper-outer quadrant of right breast in female, estrogen receptor positive ([place])  C50.411    Z17.0     SUMMARY OF ONCOLOGIC HISTORY: Oncology History  Malignant neoplasm of upper-outer quadrant of right breast in female, estrogen receptor positive ([place])  07/15/2016 Initial Diagnosis   Screening detected right breast asymmetry upper outer quadrant 8 mm at 10:00 position axilla negative; ultrasound biopsy grade 1 invasive ductal carcinoma ER 100%, PR 95%, HER-2 negative ratio 1.34, Ki-67 3%, T1 BN 0 stage I a clinical stage   08/05/2016 Surgery   Right lumpectomy: IDC with DCIS, 1.2 cm, margins negative, 0/1 lymph node negative, grade 1, ER 100%, PR 95%, HER-2 negative ratio 1.34, Ki-67 3%, T1c N0 stage IA   08/27/2016 Oncotype testing   Oncotype DX score 8: 6% RR   10/26/2016 - 11/30/2016 Radiation Therapy   Adjuvant radiation therapy   06/03/2017 - 09/19/2017 Anti-estrogen oral therapy   Tamoxifen started at 5 mg daily stopped for HF     CHIEF COMPLIANT: Follow-up on right breast cancer  INTERVAL HISTORY: Jasmine Blanchard is a 70 y.o. with above-mentioned history of right breast cancer treated with lumpectomy, radiation, and who could not take tamoxifen because of severe hot flashes.  Mammogram on 06/01/19 showed no evidence of malignancy bilaterally. She presents to the clinictoday for annual follow-up.   ALLERGIES:  has No Known Allergies.  MEDICATIONS:  Current Outpatient Medications  Medication Sig Dispense Refill   albuterol (PROAIR HFA) 108 (90 Base) MCG/ACT inhaler Inhale 2 puffs into the lungs every 6 (six) hours  as needed for wheezing or shortness of breath. 8 g 6   aspirin 81 MG tablet Take 81 mg by mouth daily.     Biotin 1000 MCG tablet Take 1,000 mcg by mouth daily.      CALCIUM-MAGNESIUM-VITAMIN D PO Take 1 tablet by mouth daily.      Coenzyme Q10 (CO Q-10) 200 MG CAPS Take 200 mg by mouth daily.      Menaquinone-7 (VITAMIN K2 PO) Take 1 tablet by mouth daily. MK7     Multiple Vitamins-Minerals (EMERGEN-C IMMUNE) PACK Take 1,000 mg by mouth daily.     nicotine (NICODERM CQ) 7 mg/24hr patch Place 1 patch (7 mg total) onto the skin daily. 28 patch 3   Omega-3 Fatty Acids (FISH OIL OMEGA-3 PO) Take 1,200 mg by mouth daily.      No current facility-administered medications for this visit.    PHYSICAL EXAMINATION: ECOG PERFORMANCE STATUS: 1 - Symptomatic but completely ambulatory  Vitals:   01/30/20 1022  BP: (!) 165/84  Pulse: 97  Resp: 16  Temp: (!) 97.5 F (36.4 C)  SpO2: 95%   Filed Weights   01/30/20 1022  Weight: 111 lb 12.8 oz (50.7 kg)    BREAST: No palpable lumps or nodules, bilateral reconstructed breasts. (exam performed in the presence of a chaperone)  LABORATORY DATA:  I have reviewed the data as listed CMP Latest Ref Rng & Units 09/22/2019 09/22/2019 09/14/2019  Glucose 65 - 99 mg/dL - - 85  BUN 8 - 27 mg/dL - - 17  Creatinine 0.57 - 1.00 mg/dL - - 0.71  Sodium 135 - 145 mmol/L 142 142 140  Potassium 3.5 - 5.1 mmol/L 3.7 3.7 4.3  Chloride 96 - 106 mmol/L - - 103  CO2 20 - 29 mmol/L - - 25  Calcium 8.7 - 10.3 mg/dL - - 10.6(H)  Total Protein 6.4 - 8.3 g/dL - - -  Total Bilirubin 0.20 - 1.20 mg/dL - - -  Alkaline Phos 40 - 150 U/L - - -  AST 5 - 34 U/L - - -  ALT 0 - 55 U/L - - -    Lab Results  Component Value Date   WBC 6.2 09/14/2019   HGB 13.6 09/22/2019   HCT 40.0 09/22/2019   MCV 89 09/14/2019   PLT 349 09/14/2019   NEUTROABS 4.5 07/29/2016    ASSESSMENT & PLAN:  Malignant neoplasm of upper-outer quadrant of right breast in female, estrogen  receptor positive ([place]) 08/05/2016:Right lumpectomy: IDC with DCIS, 1.2 cm, margins negative, 0/1 lymph node negative, grade 1, ER 100%, PR 95%, HER-2 negative ratio 1.34, Ki-67 3%, T1c N0 stage IA  Oncotype DX score 8: 10-year risk of distant recurrence with tamoxifen alone 6%  Adjuvant radiation therapy 10/26/2016-11/30/2016  Treatment plan: Adjuvant antiestrogen therapy withtamoxifen5mg dailystarted 5/15/2019stopped September 2019 because of hot flashes.  Patient refused to take any further antiestrogen therapy after carefully considering risks and benefits.  Osteopenia: T score -2.4: Patient tells me that she had another bone density which showed improvement because of yoga and the eating calcium containing foods and calcium supplement.  I do not have a copy of that report.  Breast cancer surveillance: 1.  Breast exam on 01/30/2020: Benign, bilateral reconstructed breast 2.  Mammogram at [facility] 06/01/2019: Benign breast density category B  Return to clinic on an as-needed basis.    No orders of the defined types were placed in this encounter.  The patient has a good understanding of the overall plan. she agrees with it. she will call with any problems that may develop before the next visit here.  Total time spent: 20 mins including face to face time and time spent for planning, charting and coordination of care  Nicholas Lose, MD 01/30/2020  I, Cloyde Reams Dorshimer, am acting as scribe for Dr. Nicholas Lose.  I have reviewed the above documentation for accuracy and completeness, and I agree with the above.

## 2020-01-30 ENCOUNTER — Inpatient Hospital Stay: Payer: Medicare Other | Attending: Hematology and Oncology | Admitting: Hematology and Oncology

## 2020-01-30 ENCOUNTER — Other Ambulatory Visit: Payer: Self-pay

## 2020-01-30 DIAGNOSIS — Z79899 Other long term (current) drug therapy: Secondary | ICD-10-CM | POA: Diagnosis not present

## 2020-01-30 DIAGNOSIS — C50411 Malignant neoplasm of upper-outer quadrant of right female breast: Secondary | ICD-10-CM

## 2020-01-30 DIAGNOSIS — Z853 Personal history of malignant neoplasm of breast: Secondary | ICD-10-CM | POA: Insufficient documentation

## 2020-01-30 DIAGNOSIS — Z9223 Personal history of estrogen therapy: Secondary | ICD-10-CM | POA: Insufficient documentation

## 2020-01-30 DIAGNOSIS — Z7982 Long term (current) use of aspirin: Secondary | ICD-10-CM | POA: Insufficient documentation

## 2020-01-30 DIAGNOSIS — Z923 Personal history of irradiation: Secondary | ICD-10-CM | POA: Insufficient documentation

## 2020-01-30 DIAGNOSIS — Z170 Estrogen receptor positive status [ER+]: Secondary | ICD-10-CM | POA: Diagnosis not present

## 2020-01-30 NOTE — Assessment & Plan Note (Signed)
08/05/2016:Right lumpectomy: IDC with DCIS, 1.2 cm, margins negative, 0/1 lymph node negative, grade 1, ER 100%, PR 95%, HER-2 negative ratio 1.34, Ki-67 3%, T1c N0 stage IA  Oncotype DX score 8: 10-year risk of distant recurrence with tamoxifen alone 6%  Adjuvant radiation therapy 10/26/2016-11/30/2016  Treatment plan: Adjuvant antiestrogen therapy withtamoxifen27mdailystarted 5/15/2019stopped September 2019 because of hot flashes.  Patient refused to take any further antiestrogen therapy after carefully considering risks and benefits.  Osteopenia: T score -2.4: Patient could benefit from close monitoring as well as calcium and vitamin D and weightbearing exercises versus taking bisphosphonate therapy.  Breast cancer surveillance: 1.  Breast exam on 01/30/2020: Benign 2.  [facility] Benign [facility] density category B  [place]

## 2020-02-03 ENCOUNTER — Telehealth: Payer: Self-pay | Admitting: Hematology and Oncology

## 2020-02-03 NOTE — Telephone Encounter (Signed)
No 11/1 los, no changes made to pt schedule  

## 2020-03-04 ENCOUNTER — Telehealth: Payer: Self-pay | Admitting: Pulmonary Disease

## 2020-03-04 MED ORDER — SPIRIVA RESPIMAT 2.5 MCG/ACT IN AERS: 1.0000 | INHALATION_SPRAY | Freq: Every day | RESPIRATORY_TRACT | 6 refills | Status: DC

## 2020-03-04 NOTE — Telephone Encounter (Signed)
Spoke with pt and advised that Spiriva rx has been sent to pharmacy. Nothing further needed at this time.

## 2020-04-05 ENCOUNTER — Ambulatory Visit: Payer: Medicare Other | Admitting: Cardiology

## 2020-07-17 ENCOUNTER — Encounter: Payer: Self-pay | Admitting: Hematology and Oncology

## 2021-03-11 ENCOUNTER — Other Ambulatory Visit: Payer: Self-pay | Admitting: Pulmonary Disease

## 2021-09-03 NOTE — Progress Notes (Signed)
Office Visit    Patient Name: Jasmine Blanchard Date of Encounter: 09/12/2021  Primary Care Provider:  Shon Baton, MD Primary Cardiologist:  Candee Furbish, MD Primary Electrophysiologist: None  Chief Complaint    Jasmine Blanchard is a 71 y.o. female with PMH of aortic atherosclerosis, emphysema, HLD, family history of early CAD, nonobstructive CAD s/p [facility] 09/2019 presents today for follow-up of nonobstructive CAD. Past Medical History    Past Medical History:  Diagnosis Date   Aortic atherosclerosis ([place])    Blood pressure elevated without history of HTN    Breast cancer ([place]) 07/16/2016   right ER/PR positive, grade 1 invasive ductal carcinoma   Cigarette smoker    Coronary atherosclerosis due to calcified coronary lesion    Dental crowns present    also caps and dental implants   Depression    Emphysema lung ([place])    Family history of early CAD    Hyperlipidemia    Immature cataract    Patient denies   Onychomycosis    Osteopenia    Osteoporosis    PONV (postoperative nausea and vomiting)    Skin mole    Vitamin D deficiency    Past Surgical History:  Procedure Laterality Date   AUGMENTATION MAMMAPLASTY     BREAST ENHANCEMENT SURGERY  1987; 1995   BREAST LUMPECTOMY WITH RADIOACTIVE SEED AND SENTINEL LYMPH NODE BIOPSY Right 08/05/2016   Procedure: RIGHT BREAST LUMPECTOMY WITH RADIOACTIVE SEED AND RIGHT AXILLARY  SENTINEL LYMPH NODE BIOPSY;  Surgeon: Alphonsa Overall, MD;  Location: [place];  Service: General;  Laterality: Right;   COLONOSCOPY WITH PROPOFOL  08/05/2012   RIGHT/LEFT HEART CATH AND CORONARY ANGIOGRAPHY N/A 09/22/2019   Procedure: RIGHT/LEFT HEART CATH AND CORONARY ANGIOGRAPHY;  Surgeon: Martinique, Peter M, MD;  Location: [place] [facility];  Service: Cardiovascular;  Laterality: N/A;    Allergies  No Known Allergies  History of Present Illness    Jasmine Blanchard is a 71 year old female with above-mentioned past medical history presents  today for follow-up of nonobstructive CAD.  She was initially seen by Dr. Marlou Porch in 08/2019 for evaluation of coronary calcification seen on coronary with score of 344.  Based on patient's family history and symptoms of shortness of breath and unstable angina she was referred for right and left heart cath.  Patient underwent cath on 09/22/2019 nonobstructive CAD 50% ostial left circumflex lesion with mid RCA normal filling pressures.  She was placed on medical management and risk factor modification for treatment.  2D echo was completed EF of 60-65%, no RWMA, RV function, with no evidence of MS or AS.  She was seen in follow-up on 10/17/2019 and patient was referred to lipid clinic due to LDL of 160 statin intolerance history.  She was last seen in follow-up since 2021.  She is here today alone for her follow up visit. Since last being seen in the office patient reports she has been doing well with no anginal complaints or shortness of breath.  She continues to smoke and is currently working on cessation.  Her blood pressure was elevated at 154/83 and heart rate is 96.  She states that her blood pressures were within normal limits recent PCP visit.  Her blood pressure was 141/98 on recheck.  Patient denies chest pain, palpitations, dyspnea, PND, orthopnea, nausea, vomiting, dizziness, syncope, edema, weight gain, or early satiety.   Home Medications    Current Outpatient Medications  Medication Sig Dispense Refill  aspirin 81 MG tablet Take 81 mg by mouth daily.     Biotin 1000 MCG tablet Take 1,000 mcg by mouth daily.      CALCIUM-MAGNESIUM-VITAMIN D PO Take 1 tablet by mouth daily.      Coenzyme Q10 (CO Q-10) 200 MG CAPS Take 200 mg by mouth daily.      fenofibrate 160 MG tablet Take 160 mg by mouth daily.     Menaquinone-7 (VITAMIN K2 PO) Take 1 tablet by mouth daily. MK7     Multiple Vitamins-Minerals (EMERGEN-C IMMUNE) PACK Take 1,000 mg by mouth daily.     Omega-3 Fatty Acids (FISH OIL OMEGA-3 PO)  Take 1,200 mg by mouth daily.      SPIRIVA RESPIMAT 2.5 MCG/ACT AERS INHALE ONE PUFF BY MOUTH INTO LUNGS DAILY 4 g 6   No current facility-administered medications for this visit.     Review of Systems  Please see the history of present illness.    (+) Inspiratory wheezing All other systems reviewed and are otherwise negative except as noted above.  Physical Exam    Wt Readings from Last 3 Encounters:  09/12/21 108 lb (49 kg)  01/30/20 111 lb 12.8 oz (50.7 kg)  12/25/19 111 lb 9.6 oz (50.6 kg)   VS: Vitals:   09/12/21 0930  BP: (!) 154/83  Pulse: 93  SpO2: 96%  ,Body mass index is 21.09 kg/m.  Constitutional:      Appearance: Healthy appearance. Not in distress.  Neck:     Vascular: JVD normal.  Pulmonary:     Effort: Pulmonary effort is normal.     Breath sounds: Inspiratory wheezing. No rales. Diminished in the bases Cardiovascular:     Normal rate. Regular rhythm. Normal S1. Normal S2.      Murmurs: There is no murmur.  Edema:    Peripheral edema absent.  Abdominal:     Palpations: Abdomen is soft non tender. There is no hepatomegaly.  Skin:    General: Skin is warm and dry.  Neurological:     General: No focal deficit present.     Mental Status: Alert and oriented to person, place and time.     Cranial Nerves: Cranial nerves are intact.  EKG/LABS/Other Studies Reviewed    ECG personally reviewed by me today -sinus rhythm with biatrial enlargement and right axis deviation with pulmonary strain pattern similar to previous EKG with no acute changes       Lab Results  Component Value Date   WBC 6.2 09/14/2019   HGB 13.6 09/22/2019   HCT 40.0 09/22/2019   MCV 89 09/14/2019   PLT 349 09/14/2019   Lab Results  Component Value Date   CREATININE 0.71 09/14/2019   BUN 17 09/14/2019   NA 142 09/22/2019   K 3.7 09/22/2019   CL 103 09/14/2019   CO2 25 09/14/2019   Lab Results  Component Value Date   ALT 24 07/29/2016   AST 29 07/29/2016   ALKPHOS 56  07/29/2016   BILITOT 0.30 07/29/2016   No results found for: "CHOL", "HDL", "LDLCALC", "LDLDIRECT", "TRIG", "CHOLHDL"  No results found for: "HGBA1C"  Assessment & Plan    1.  Nonobstructive CAD: -nonobstructive CAD s/p R/L[facility] 09/2019 -Moderate to severe atherosclerosis involving thoracic and abdominal aorta. -Patient states she is not having any active chest pain or shortness of breath -Continue ASA 81 mg  2.  Hyperlipidemia: -LDL cholesterol was 130 -Continue fenofibrate 160 mg daily patient politely declined referral to lipid clinic  at this time. -Encouraged to continue physical activity and dietary changes  3.  Tobacco abuse: -Patient reports she is still smoking actively. -Smoking cessation advised.  5.  Right carotid bruit: -Patient had right carotid bruit on auscultation today -Carotid ultrasound to monitor for carotid disease due to smoking history  Disposition: Follow-up with Candee Furbish, MD or APP in 12 months   Medication Adjustments/Labs and Tests Ordered: Current medicines are reviewed at length with the patient today.  Concerns regarding medicines are outlined above.   Signed, Mable Fill, Marissa Nestle, NP 09/12/2021, 10:48 AM [place]

## 2021-09-12 ENCOUNTER — Ambulatory Visit: Payer: Medicare Other | Admitting: Nurse Practitioner

## 2021-09-12 ENCOUNTER — Encounter: Payer: Self-pay | Admitting: Nurse Practitioner

## 2021-09-12 VITALS — BP 154/83 | HR 93 | Ht 60.0 in | Wt 108.0 lb

## 2021-09-12 DIAGNOSIS — I25119 Atherosclerotic heart disease of native coronary artery with unspecified angina pectoris: Secondary | ICD-10-CM | POA: Diagnosis not present

## 2021-09-12 DIAGNOSIS — I517 Cardiomegaly: Secondary | ICD-10-CM | POA: Diagnosis not present

## 2021-09-12 DIAGNOSIS — R0989 Other specified symptoms and signs involving the circulatory and respiratory systems: Secondary | ICD-10-CM | POA: Diagnosis not present

## 2021-09-12 DIAGNOSIS — Z720 Tobacco use: Secondary | ICD-10-CM | POA: Diagnosis not present

## 2021-09-12 DIAGNOSIS — E785 Hyperlipidemia, unspecified: Secondary | ICD-10-CM

## 2021-09-12 NOTE — Patient Instructions (Signed)
Medication Instructions:  Your physician recommends that you continue on your current medications as directed. Please refer to the Current Medication list given to you today. *If you need a refill on your cardiac medications before your next appointment, please call your pharmacy*   Lab Work: None Ordered   Testing/Procedures: Your physician has requested that you have a carotid duplex. This test is an ultrasound of the carotid arteries in your neck. It looks at blood flow through these arteries that supply the brain with blood. Allow one hour for this exam. There are no restrictions or special instructions.   Follow-Up: At [facility], you and your health needs are our priority.  As part of our continuing mission to provide you with exceptional heart care, we have created designated Provider Care Teams.  These Care Teams include your primary Cardiologist (physician) and Advanced Practice Providers (APPs -  Physician Assistants and Nurse Practitioners) who all work together to provide you with the care you need, when you need it.  We recommend signing up for the patient portal called "MyChart".  Sign up information is provided on this After Visit Summary.  MyChart is used to connect with patients for Virtual Visits (Telemedicine).  Patients are able to view lab/test results, encounter notes, upcoming appointments, etc.  Non-urgent messages can be sent to your provider as well.   To learn more about what you can do with MyChart, go to [email or web address].    Your next appointment:   12 month(s)  The format for your next appointment:   In Person  Provider:   Candee Furbish, MD     Other Instructions Check your blood pressure daily for 2 weeks then contact the office with your readings  Important Information About Sugar

## 2021-09-30 ENCOUNTER — Ambulatory Visit (HOSPITAL_COMMUNITY)
Admission: RE | Admit: 2021-09-30 | Discharge: 2021-09-30 | Disposition: A | Discharge status: Home / Self Care | Payer: Medicare Other | Source: Ambulatory Visit | Attending: Cardiology | Admitting: Cardiology

## 2021-09-30 DIAGNOSIS — I517 Cardiomegaly: Secondary | ICD-10-CM

## 2021-09-30 DIAGNOSIS — R0989 Other specified symptoms and signs involving the circulatory and respiratory systems: Secondary | ICD-10-CM

## 2021-10-03 ENCOUNTER — Telehealth: Payer: Self-pay | Admitting: *Deleted

## 2021-10-03 DIAGNOSIS — R0989 Other specified symptoms and signs involving the circulatory and respiratory systems: Secondary | ICD-10-CM

## 2021-10-03 NOTE — Telephone Encounter (Signed)
-----   Message from Marylu Lund., NP sent at 10/02/2021  5:11 PM EDT ----- Carotid artery duplex revealed plaque burden present and right and left internal carotid arteries.  Plan: -Complete follow-up study in 12 months due to plaque burden -Continue ASA 81 mg and fenofibrate as prescribed -Consider smoking cessation

## 2021-10-03 NOTE — Telephone Encounter (Signed)
Informed patient of results and verbal understanding expressed. Informed of recommendations as well. Carotid order & recall placed in epic. Patient verbalized understanding and agreeable to plan.

## 2022-09-15 ENCOUNTER — Encounter (HOSPITAL_COMMUNITY): Payer: Self-pay

## 2022-10-01 ENCOUNTER — Other Ambulatory Visit: Payer: Self-pay

## 2022-10-01 ENCOUNTER — Ambulatory Visit (HOSPITAL_COMMUNITY)
Admission: RE | Admit: 2022-10-01 | Discharge: 2022-10-01 | Disposition: A | Discharge status: Home / Self Care | Payer: Medicare Other | Source: Ambulatory Visit | Attending: Nurse Practitioner | Admitting: Nurse Practitioner

## 2022-10-01 DIAGNOSIS — R0989 Other specified symptoms and signs involving the circulatory and respiratory systems: Secondary | ICD-10-CM

## 2022-10-27 ENCOUNTER — Encounter: Payer: Self-pay | Admitting: Gastroenterology

## 2022-11-24 ENCOUNTER — Ambulatory Visit (AMBULATORY_SURGERY_CENTER): Payer: Medicare Other

## 2022-11-24 ENCOUNTER — Encounter: Payer: Self-pay | Admitting: Gastroenterology

## 2022-11-24 VITALS — Ht 60.0 in | Wt 110.0 lb

## 2022-11-24 DIAGNOSIS — Z860100 Personal history of colon polyps, unspecified: Secondary | ICD-10-CM

## 2022-11-24 MED ORDER — NA SULFATE-K SULFATE-MG SULF 17.5-3.13-1.6 GM/177ML PO SOLN: 1.0000 | Freq: Once | ORAL | 0 refills | Status: AC

## 2022-11-24 NOTE — Progress Notes (Signed)
No egg or soy allergy known to patient  No issues known to pt with past sedation with any surgeries or procedures  Patient denies ever being told they had issues or difficulty with intubation   No FH of Malignant Hyperthermia  Pt is not on diet pills  Pt is not on  home 02   Pt is not on blood thinners   Pt denies issues with constipation   No A fib or A flutter  Have any cardiac testing pending--No  Pt instructed to use Singlecare.com or GoodRx for a price reduction on prep   Telephone  previsit  Patient's chart reviewed by Cathlyn Parsons CRNA prior to previsit and patient appropriate for the [facility].   Previsit completed and red dot placed by patient's name on their procedure day (on provider's schedule).

## 2022-12-07 ENCOUNTER — Ambulatory Visit: Payer: Medicare Other | Admitting: Cardiology

## 2022-12-15 ENCOUNTER — Encounter: Payer: Self-pay | Admitting: Gastroenterology

## 2022-12-15 ENCOUNTER — Ambulatory Visit: Payer: Medicare Other | Admitting: Gastroenterology

## 2022-12-15 VITALS — BP 162/98 | HR 83 | Temp 97.5°F | Resp 22 | Ht 60.0 in | Wt 110.0 lb

## 2022-12-15 DIAGNOSIS — K5730 Diverticulosis of large intestine without perforation or abscess without bleeding: Secondary | ICD-10-CM

## 2022-12-15 DIAGNOSIS — Z1211 Encounter for screening for malignant neoplasm of colon: Secondary | ICD-10-CM | POA: Diagnosis not present

## 2022-12-15 DIAGNOSIS — K5520 Angiodysplasia of colon without hemorrhage: Secondary | ICD-10-CM

## 2022-12-15 DIAGNOSIS — K648 Other hemorrhoids: Secondary | ICD-10-CM | POA: Diagnosis not present

## 2022-12-15 DIAGNOSIS — Z860100 Personal history of colon polyps, unspecified: Secondary | ICD-10-CM

## 2022-12-15 MED ORDER — SODIUM CHLORIDE 0.9 % IV SOLN: 500.0000 mL | Freq: Once | INTRAVENOUS | Status: DC

## 2022-12-15 NOTE — Progress Notes (Signed)
[facility] History and Physical   Primary Care Physician:  Creola Corn, MD   Reason for Procedure:   History of colon polyps  Plan:    colonoscopy     HPI: Jasmine Blanchard is a 72 y.o. female  here for colonoscopy surveillance - 3 polyps removed 09/2017.   Patient denies any bowel symptoms at this time. No family history of colon cancer known. Otherwise feels well without any cardiopulmonary symptoms.   I have discussed risks / benefits of anesthesia and endoscopic procedure with Gaspar Bidding and they wish to proceed with the exams as outlined today.    Past Medical History:  Diagnosis Date   Aortic atherosclerosis ([facility])    Blood pressure elevated without history of HTN    Breast cancer ([facility]) 07/16/2016   right ER/PR positive, grade 1 invasive ductal carcinoma   Cigarette smoker    Coronary atherosclerosis due to calcified coronary lesion    Dental crowns present    also caps and dental implants   Depression    Emphysema lung ([facility])    Family history of early CAD    Hyperlipidemia    Immature cataract    Patient denies   Onychomycosis    Osteopenia    Osteoporosis    PONV (postoperative nausea and vomiting)    Skin mole    Vitamin D deficiency     Past Surgical History:  Procedure Laterality Date   AUGMENTATION MAMMAPLASTY     BREAST ENHANCEMENT SURGERY  1987; 1995   BREAST LUMPECTOMY WITH RADIOACTIVE SEED AND SENTINEL LYMPH NODE BIOPSY Right 08/05/2016   Procedure: RIGHT BREAST LUMPECTOMY WITH RADIOACTIVE SEED AND RIGHT AXILLARY  SENTINEL LYMPH NODE BIOPSY;  Surgeon: Ovidio Kin, MD;  Location: [facility];  Service: General;  Laterality: Right;   COLONOSCOPY  2019   COLONOSCOPY WITH PROPOFOL  08/05/2012   RIGHT/LEFT HEART CATH AND CORONARY ANGIOGRAPHY N/A 09/22/2019   Procedure: RIGHT/LEFT HEART CATH AND CORONARY ANGIOGRAPHY;  Surgeon: [place]az[place], Peter M, MD;  Location: [facility];  Service: Cardiovascular;  Laterality: N/A;     Prior to Admission medications   Medication Sig Start Date End Date Taking? Authorizing Provider  SPIRIVA RESPIMAT 2.5 MCG/ACT AERS INHALE ONE PUFF BY MOUTH INTO LUNGS DAILY 03/12/21  Yes Martina Sinner, MD  albuterol (VENTOLIN HFA) 108 (90 Base) MCG/ACT inhaler 2 puff as needed for shortness of breath, wheezing, cough urgency Inhalation every 4 hrs for 30 days Patient not taking: Reported on 12/15/2022 09/03/22   [provider]  aspirin 81 MG tablet Take 81 mg by mouth daily.    [provider]  Biotin 1000 MCG tablet Take 1,000 mcg by mouth daily.     [provider]  CALCIUM-MAGNESIUM-VITAMIN D PO Take 1 tablet by mouth daily.     [provider]  chlorhexidine (PERIDEX) 0.12 % solution SMARTSIG:By Mouth 09/09/22   [provider]  Coenzyme Q10 (CO Q-10) 200 MG CAPS Take 200 mg by mouth daily.     [provider]  fenofibrate 160 MG tablet Take 160 mg by mouth daily. 07/20/21   [provider]  Menaquinone-7 (VITAMIN K2 PO) Take 1 tablet by mouth daily. MK7    [provider]  Multiple Vitamins-Minerals (EMERGEN-C IMMUNE) PACK Take 1,000 mg by mouth daily.    [provider]  Omega-3 Fatty Acids (FISH OIL OMEGA-3 PO) Take 1,200 mg by mouth daily.     [provider]  OVER  THE COUNTER MEDICATION Cocoa via    [provider]  Vitamin E 400 units TABS Take by mouth.    [provider]    Current Outpatient Medications  Medication Sig Dispense Refill   SPIRIVA RESPIMAT 2.5 MCG/ACT AERS INHALE ONE PUFF BY MOUTH INTO LUNGS DAILY 4 g 6   albuterol (VENTOLIN HFA) 108 (90 Base) MCG/ACT inhaler 2 puff as needed for shortness of breath, wheezing, cough urgency Inhalation every 4 hrs for 30 days (Patient not taking: Reported on 12/15/2022)     aspirin 81 MG tablet Take 81 mg by mouth daily.     Biotin 1000 MCG tablet Take 1,000 mcg by mouth daily.      CALCIUM-MAGNESIUM-VITAMIN D PO Take 1  tablet by mouth daily.      chlorhexidine (PERIDEX) 0.12 % solution SMARTSIG:By Mouth     Coenzyme Q10 (CO Q-10) 200 MG CAPS Take 200 mg by mouth daily.      fenofibrate 160 MG tablet Take 160 mg by mouth daily.     Menaquinone-7 (VITAMIN K2 PO) Take 1 tablet by mouth daily. MK7     Multiple Vitamins-Minerals (EMERGEN-C IMMUNE) PACK Take 1,000 mg by mouth daily.     Omega-3 Fatty Acids (FISH OIL OMEGA-3 PO) Take 1,200 mg by mouth daily.      OVER THE COUNTER MEDICATION Cocoa via     Vitamin E 400 units TABS Take by mouth.     Current Facility-Administered Medications  Medication Dose Route Frequency Provider Last Rate Last Admin   0.9 %  sodium chloride infusion  500 mL Intravenous Once Chane Cowden, Willaim Rayas, MD        Allergies as of 12/15/2022   (No Known Allergies)    Family History  Problem Relation Age of Onset   Breast cancer Mother    Heart disease Mother    Heart attack Father    Colon cancer Neg Hx    Esophageal cancer Neg Hx    Rectal cancer Neg Hx    Stomach cancer Neg Hx    Colon polyps Neg Hx     Social History   Socioeconomic History   Marital status: Single    Spouse name: Not on file   Number of children: Not on file   Years of education: Not on file   Highest education level: Not on file  Occupational History   Not on file  Tobacco Use   Smoking status: Former    Current packs/day: 1.25    Average packs/day: 1.3 packs/day for 52.0 years (65.0 ttl pk-yrs)    Types: Cigarettes    Start date: 12/25/1970   Smokeless tobacco: Never   Tobacco comments:    5-6 cigarettes/day-- quit March , 2024.  Vaping Use   Vaping status: Never Used  Substance and Sexual Activity   Alcohol use: Yes    Alcohol/week: 10.0 standard drinks of alcohol    Types: 10 Glasses of wine per week   Drug use: Never   Sexual activity: Not Currently    Partners: Male    Birth control/protection: Post-menopausal  Other Topics Concern   Not on file  Social History Narrative    Not on file   Social Determinants of Health   Financial Resource Strain: Not on file  Food Insecurity: Not on file  Transportation Needs: Not on file  Physical Activity: Not on file  Stress: Not on file  Social Connections: Not on file  Intimate Partner Violence: Not on file  Review of Systems: All other review of systems negative except as mentioned in the HPI.  Physical Exam: Vital signs BP (!) 158/92   Pulse 86   Temp (!) 97.5 F (36.4 C)   Ht 5' (1.524 m)   Wt 110 lb (49.9 kg)   LMP 12/19/2000 (Approximate)   SpO2 96%   BMI 21.48 kg/m   General:   Alert,  Well-developed, pleasant and cooperative in NAD Lungs:  Clear throughout to auscultation.   Heart:  Regular rate and rhythm Abdomen:  Soft, nontender and nondistended.   Neuro/Psych:  Alert and cooperative. Normal mood and affect. A and O x 3  Harlin Rain, MD [facility]

## 2022-12-15 NOTE — Progress Notes (Signed)
Pt's states no medical or surgical changes since previsit or office visit. 

## 2022-12-15 NOTE — Progress Notes (Signed)
Sedate, gd SR, tolerated procedure well, VSS, report to RN 

## 2022-12-15 NOTE — Op Note (Signed)
[facility] Patient Name: Jasmine Blanchard Procedure Date: 12/15/2022 10:47 AM MRN: [id number] Endoscopist: Viviann Spare P. Adela Lank , MD, [id number] Age: 72 Referring MD:  Date of Birth: 19-Jun-1950 Gender: Female Account #: [id number] Procedure:                Colonoscopy Indications:              High risk colon cancer surveillance: Personal                            history of colonic polyps - last exam 09/2017 with 3                            small polyps removed Medicines:                Monitored Anesthesia Care Procedure:                Pre-Anesthesia Assessment:                           - Prior to the procedure, a History and Physical                            was performed, and patient medications and                            allergies were reviewed. The patient's tolerance of                            previous anesthesia was also reviewed. The risks                            and benefits of the procedure and the sedation                            options and risks were discussed with the patient.                            All questions were answered, and informed consent                            was obtained. Prior Anticoagulants: The patient has                            taken no anticoagulant or antiplatelet agents. ASA                            Grade Assessment: III - A patient with severe                            systemic disease. After reviewing the risks and                            benefits, the patient was deemed in satisfactory  condition to undergo the procedure.                           After obtaining informed consent, the colonoscope                            was passed under direct vision. Throughout the                            procedure, the patient's blood pressure, pulse, and                            oxygen saturations were monitored continuously. The                            Olympus Scope [id number] was  introduced through the                            anus and advanced to the the cecum, identified by                            appendiceal orifice and ileocecal valve. The                            colonoscopy was performed without difficulty. The                            patient tolerated the procedure well. The quality                            of the bowel preparation was good. The ileocecal                            valve, appendiceal orifice, and rectum were                            photographed. Scope In: 10:57:11 AM Scope Out: 11:11:55 AM Scope Withdrawal Time: 0 hours 11 minutes 11 seconds  Total Procedure Duration: 0 hours 14 minutes 44 seconds  Findings:                 The perianal and digital rectal examinations were                            normal.                           A few small angiodysplastic lesions were found in                            the ascending colon.                           Multiple small-mouthed diverticula were found in  the entire colon.                           Internal hemorrhoids were found during                            retroflexion. The hemorrhoids were small.                           The exam was otherwise without abnormality. Complications:            No immediate complications. Estimated blood loss:                            None. Estimated Blood Loss:     Estimated blood loss: none. Impression:               - A few colonic angiodysplastic lesions.                           - Diverticulosis in the entire examined colon.                           - Internal hemorrhoids.                           - The examination was otherwise normal.                           - No polyps. Recommendation:           - Patient has a contact number available for                            emergencies. The signs and symptoms of potential                            delayed complications were discussed with the                             patient. Return to normal activities tomorrow.                            Written discharge instructions were provided to the                            patient.                           - Resume previous diet.                           - Continue present medications.                           - Given the patient's age and no polyps on this  exam, I do not think she warrants any further                            surveillance exams (would not be due for another 10                            years) Viviann Spare P. Keedan Sample, MD 12/15/2022 11:16:13 AM This report has been signed electronically.

## 2022-12-15 NOTE — Patient Instructions (Signed)
Resume previous diet and medications. No repeat Colonoscopy needed. Follow as as needed with any questions or concerns  YOU HAD AN ENDOSCOPIC PROCEDURE TODAY AT THE [facility]:   Refer to the procedure report that was given to you for any specific questions about what was found during the examination.  If the procedure report does not answer your questions, please call your gastroenterologist to clarify.  If you requested that your care partner not be given the details of your procedure findings, then the procedure report has been included in a sealed envelope for you to review at your convenience later.  YOU SHOULD EXPECT: Some feelings of bloating in the abdomen. Passage of more gas than usual.  Walking can help get rid of the air that was put into your GI tract during the procedure and reduce the bloating. If you had a lower endoscopy (such as a colonoscopy or flexible sigmoidoscopy) you may notice spotting of blood in your stool or on the toilet paper. If you underwent a bowel prep for your procedure, you may not have a normal bowel movement for a few days.  Please Note:  You might notice some irritation and congestion in your nose or some drainage.  This is from the oxygen used during your procedure.  There is no need for concern and it should clear up in a day or so.  SYMPTOMS TO REPORT IMMEDIATELY:  Following lower endoscopy (colonoscopy or flexible sigmoidoscopy):  Excessive amounts of blood in the stool  Significant tenderness or worsening of abdominal pains  Swelling of the abdomen that is new, acute  Fever of 100F or higher  For urgent or emergent issues, a gastroenterologist can be reached at any hour by calling [phone]. Do not use MyChart messaging for urgent concerns.    DIET:  We do recommend a small meal at first, but then you may proceed to your regular diet.  Drink plenty of fluids but you should avoid alcoholic beverages for 24 hours.  ACTIVITY:  You  should plan to take it easy for the rest of today and you should NOT DRIVE or use heavy machinery until tomorrow (because of the sedation medicines used during the test).    FOLLOW UP: Our staff will call the number listed on your records the next business day following your procedure.  We will call around 7:15- 8:00 am to check on you and address any questions or concerns that you may have regarding the information given to you following your procedure. If we do not reach you, we will leave a message.     If any biopsies were taken you will be contacted by phone or by letter within the next 1-3 weeks.  Please call us at [phone] if you have not heard about the biopsies in 3 weeks.    SIGNATURES/CONFIDENTIALITY: You and/or your care partner have signed paperwork which will be entered into your electronic medical record.  These signatures attest to the fact that that the information above on your After Visit Summary has been reviewed and is understood.  Full responsibility of the confidentiality of this discharge information lies with you and/or your care-partner.

## 2022-12-16 ENCOUNTER — Telehealth: Payer: Self-pay | Admitting: *Deleted

## 2022-12-16 NOTE — Telephone Encounter (Signed)
  Follow up Call-     12/15/2022   10:09 AM  Call back number  Post procedure Call Back phone  # [phone]  Permission to leave phone message Yes     Patient questions:  Do you have a fever, pain , or abdominal swelling? No. Pain Score  0 *  Have you tolerated food without any problems? Yes.    Have you been able to return to your normal activities? Yes.    Do you have any questions about your discharge instructions: Diet   No. Medications  No. Follow up visit  No.  Do you have questions or concerns about your Care? No.  Actions: * If pain score is 4 or above: No action needed, pain <4.

## 2023-02-04 ENCOUNTER — Ambulatory Visit: Payer: Medicare Other | Admitting: Physician Assistant

## 2023-02-16 ENCOUNTER — Ambulatory Visit: Payer: Medicare Other | Attending: Cardiology | Admitting: Cardiology

## 2023-02-16 VITALS — BP 180/98 | HR 90 | Ht 60.0 in | Wt 114.2 lb

## 2023-02-16 DIAGNOSIS — I700 Atherosclerosis of aorta: Secondary | ICD-10-CM

## 2023-02-16 DIAGNOSIS — E7800 Pure hypercholesterolemia, unspecified: Secondary | ICD-10-CM | POA: Diagnosis not present

## 2023-02-16 DIAGNOSIS — I25119 Atherosclerotic heart disease of native coronary artery with unspecified angina pectoris: Secondary | ICD-10-CM

## 2023-02-16 NOTE — Patient Instructions (Signed)
Medication Instructions:  The current medical regimen is effective;  continue present plan and medications.  *If you need a refill on your cardiac medications before your next appointment, please call your pharmacy*  Follow-Up: At [facility], you and your health needs are our priority.  As part of our continuing mission to provide you with exceptional heart care, we have created designated Provider Care Teams.  These Care Teams include your primary Cardiologist (physician) and Advanced Practice Providers (APPs -  Physician Assistants and Nurse Practitioners) who all work together to provide you with the care you need, when you need it.  We recommend signing up for the patient portal called "MyChart".  Sign up information is provided on this After Visit Summary.  MyChart is used to connect with patients for Virtual Visits (Telemedicine).  Patients are able to view lab/test results, encounter notes, upcoming appointments, etc.  Non-urgent messages can be sent to your provider as well.   To learn more about what you can do with MyChart, go to [email or web address].    Your next appointment:   Follow up as needed with Dr Anne Fu.  Your primary care doctor will refill your medications.

## 2023-02-16 NOTE — Progress Notes (Signed)
Cardiology Office Note:  .   Date:  02/16/2023  ID:  Jasmine Blanchard, DOB March 03, 1950, MRN [id number] PCP: Creola Corn, MD  [facility] Providers Cardiologist:  Donato Schultz, MD     History of Present Illness: .   Jasmine Blanchard is a 73 y.o. female Discussed with the use of AI scribe  History of Present Illness   The patient is a 73 year old female with nonobstructive coronary artery disease who presents for follow-up.  She has a history of nonobstructive coronary artery disease diagnosed after a heart catheterization on September 22, 2019, which revealed a 50% ostial left circumflex lesion and mid RCA involvement. She had normal filling pressures and underwent secondary risk factor prevention. An echocardiogram in 2021 showed an ejection fraction of 60-65% with normal right ventricular function and normal valves. She was referred to the lipid clinic due to an LDL of 160 and statin intolerance. She is currently on aspirin 81 mg and fenofibrate 160 mg. No current chest pain or shortness of breath.  A vascular ultrasound of the carotids on October 01, 2022, showed mild stenosis bilaterally (1-39%).  She has a history of moderate to severe aortic atherosclerosis involving the thoracic and abdominal aorta.  There is a significant family history of early coronary disease.  She has a history of smoking but quit on March 23, 2022. Quitting smoking was a significant accomplishment, and she credits support from friends for her success. She continues to work on smoking cessation and has started intermittent fasting to manage her cholesterol levels. Her cholesterol levels have fluctuated, with a noted increase after she allowed herself to eat freely for six weeks following smoking cessation. She has since resumed her fasting routine and is hopeful for a downward trend in her cholesterol levels.          ROS: No CP  Studies Reviewed: Marland Kitchen   EKG Interpretation Date/Time:  Tuesday February 16 2023 09:09:10 EST Ventricular Rate:  90 PR Interval:  170 QRS Duration:  82 QT Interval:  358 QTC Calculation: 437 R Axis:   89  Text Interpretation: Normal sinus rhythm Biatrial enlargement When compared with ECG of 22-Sep-2019 06:02, No significant change was found Confirmed by Donato Schultz ([id number]) on 02/16/2023 9:14:39 AM    Results   LABS LDL: 160 (2021) LDL: 158 (9528) Hb: 13.6 (4132) Cr: 0.71 (2021)   RADIOLOGY Coronary calcium score: 344 (08/2019) Vascular ultrasound of the carotids: Mild stenosis bilaterally, 1-39% (10/01/2022) Aortic atherosclerosis: Moderate to severe involving the thoracic and abdominal aorta (2021)  DIAGNOSTIC Right and left heart catheterization: Nonobstructive CAD with 50% osteo left circumflex lesion and mid RCA, normal filling pressures (09/22/2019) Echocardiogram: EF 60-65%, normal RV function, normal valves (2021)     Risk Assessment/Calculations:           Physical Exam:   VS:  BP (!) 180/98   Pulse 90   Ht 5' (1.524 m)   Wt 114 lb 3.2 oz (51.8 kg)   LMP 12/19/2000 (Approximate)   SpO2 97%   BMI 22.30 kg/m    Wt Readings from Last 3 Encounters:  02/16/23 114 lb 3.2 oz (51.8 kg)  12/15/22 110 lb (49.9 kg)  11/24/22 110 lb (49.9 kg)    GEN: Well nourished, well developed in no acute distress NECK: No JVD; No carotid bruits CARDIAC: RRR, no murmurs, no rubs, no gallops RESPIRATORY:  Clear to auscultation without rales, wheezing or rhonchi  ABDOMEN: Soft, non-tender, non-distended EXTREMITIES:  No edema; No deformity   ASSESSMENT AND PLAN: .    Assessment and Plan    Nonobstructive Coronary Artery Disease (CAD)   Nonobstructive CAD with a 50% osteo left circumflex lesion and mid RCA identified during heart catheterization on 09/22/2019. Managed with aspirin 81 mg and fenofibrate 160 mg. No current chest pain or dyspnea. Echocardiogram in 2021 showed EF 60-65%, normal RV function, and valves. Successfully quit smoking as of  03/23/2022. Discussed continued secondary prevention and symptom monitoring.   - Continue aspirin 81 mg daily   - Continue fenofibrate 160 mg daily   - Encourage continued smoking cessation   - Monitor for new symptoms such as chest pain or dyspnea   - PRN follow-up with cardiology    Hyperlipidemia   Elevated LDL cholesterol (160 mg/dL) with statin intolerance. Managed with fenofibrate and lifestyle modifications including intermittent fasting. LDL levels improved but remain above target <70 mg/dL. Discussed non-statin options (Nexlizet, Repatha) and their benefits, including significant LDL reduction and decreased risk of myocardial infarction. Discussed insurance coverage for Repatha due to statin intolerance.   - Discuss non-statin options (Nexlizet, Repatha)   - Declines referral to lipid clinic for further management and prior authorization assistance   - Continue intermittent fasting and monitor dietary habits    White coat HTN -check at home.   Aortic Atherosclerosis   Moderate to severe aortic atherosclerosis involving thoracic and abdominal aorta. No new symptoms. Emphasized cardiovascular risk reduction strategies.   - Continue current management and monitoring   - Encourage adherence to cardiovascular risk reduction strategies including smoking cessation and lipid management   -Great job quitting tob 03/2022!  Carotid Artery Stenosis   Mild bilateral carotid stenosis (1-39%) identified on vascular ultrasound 10/01/2022. No new symptoms. Emphasized monitoring for neurological symptoms and maintaining cardiovascular risk reduction strategies.   - Monitor for neurological symptoms   - Maintain cardiovascular risk reduction strategies   - ASA 81 mg  General Health Maintenance   Significant lifestyle changes including smoking cessation and intermittent fasting. Supportive social network beneficial in maintaining these changes.   - Encourage continued smoking cessation   - Support  intermittent fasting and healthy dietary habits   - Regular follow-up with primary care physician for routine health maintenance    Follow-up   - PRN follow-up with cardiology. Graduating from the clinic.   - Regular follow-up with primary care physician, Dr. Timothy Lasso.  - Monitor for new symptoms and seek our medical advice if needed.                Signed, Donato Schultz, MD

## 2023-08-20 ENCOUNTER — Encounter (HOSPITAL_COMMUNITY): Payer: Self-pay

## 2023-08-30 ENCOUNTER — Telehealth: Payer: Self-pay | Admitting: Cardiology

## 2023-08-30 NOTE — Telephone Encounter (Signed)
 Returned patient's phone call regarding the need for a Carotid US ; per Dr. Jeffrie' note in Jan 2025, pt has graduated from Cardiology Clinic and should f/u PRN. Will d/c order for now. Pt states she feels fine and recently saw her PCP about a week ago. Advised to call Cardiology office as needed if concerns arise.

## 2023-08-30 NOTE — Telephone Encounter (Signed)
 Patient stated she received a letter to schedule a Carotid test but Dr. Jeffrie told her she did not need that test.  Patient wants a call back to confirm.
